# Patient Record
Sex: Female | Born: 1935 | Race: White | Hispanic: No | Smoking: Never smoker
Health system: Southern US, Community
[De-identification: ages and names within clinical notes are randomized; demographics above are authoritative.]

## PROBLEM LIST (undated history)

## (undated) DIAGNOSIS — R55 Syncope and collapse: Secondary | ICD-10-CM

## (undated) DIAGNOSIS — J189 Pneumonia, unspecified organism: Secondary | ICD-10-CM

## (undated) DIAGNOSIS — E039 Hypothyroidism, unspecified: Secondary | ICD-10-CM

## (undated) DIAGNOSIS — H3530 Unspecified macular degeneration: Secondary | ICD-10-CM

## (undated) DIAGNOSIS — D649 Anemia, unspecified: Secondary | ICD-10-CM

## (undated) DIAGNOSIS — K08109 Complete loss of teeth, unspecified cause, unspecified class: Secondary | ICD-10-CM

## (undated) DIAGNOSIS — M1990 Unspecified osteoarthritis, unspecified site: Secondary | ICD-10-CM

## (undated) DIAGNOSIS — H9193 Unspecified hearing loss, bilateral: Secondary | ICD-10-CM

## (undated) DIAGNOSIS — R112 Nausea with vomiting, unspecified: Secondary | ICD-10-CM

## (undated) DIAGNOSIS — Z9889 Other specified postprocedural states: Secondary | ICD-10-CM

## (undated) DIAGNOSIS — F419 Anxiety disorder, unspecified: Secondary | ICD-10-CM

## (undated) DIAGNOSIS — E782 Mixed hyperlipidemia: Secondary | ICD-10-CM

## (undated) DIAGNOSIS — M51369 Other intervertebral disc degeneration, lumbar region without mention of lumbar back pain or lower extremity pain: Secondary | ICD-10-CM

## (undated) DIAGNOSIS — N390 Urinary tract infection, site not specified: Secondary | ICD-10-CM

## (undated) DIAGNOSIS — K219 Gastro-esophageal reflux disease without esophagitis: Secondary | ICD-10-CM

## (undated) DIAGNOSIS — Z972 Presence of dental prosthetic device (complete) (partial): Secondary | ICD-10-CM

## (undated) DIAGNOSIS — I509 Heart failure, unspecified: Secondary | ICD-10-CM

## (undated) DIAGNOSIS — M5136 Other intervertebral disc degeneration, lumbar region: Secondary | ICD-10-CM

## (undated) DIAGNOSIS — I350 Nonrheumatic aortic (valve) stenosis: Secondary | ICD-10-CM

## (undated) DIAGNOSIS — K759 Inflammatory liver disease, unspecified: Secondary | ICD-10-CM

## (undated) DIAGNOSIS — I10 Essential (primary) hypertension: Secondary | ICD-10-CM

## (undated) DIAGNOSIS — R011 Cardiac murmur, unspecified: Secondary | ICD-10-CM

## (undated) HISTORY — DX: Unspecified osteoarthritis, unspecified site: M19.90

## (undated) HISTORY — DX: Anxiety disorder, unspecified: F41.9

## (undated) HISTORY — DX: Mixed hyperlipidemia: E78.2

## (undated) HISTORY — PX: THYROID SURGERY: SHX805

## (undated) HISTORY — PX: CATARACT EXTRACTION: SUR2

## (undated) HISTORY — DX: Nonrheumatic aortic (valve) stenosis: I35.0

## (undated) HISTORY — DX: Heart failure, unspecified: I50.9

## (undated) HISTORY — DX: Essential (primary) hypertension: I10

## (undated) HISTORY — PX: EYE SURGERY: SHX253

## (undated) HISTORY — DX: Hypothyroidism, unspecified: E03.9

## (undated) HISTORY — DX: Pneumonia, unspecified organism: J18.9

## (undated) HISTORY — DX: Cardiac murmur, unspecified: R01.1

## (undated) HISTORY — DX: Urinary tract infection, site not specified: N39.0

## (undated) HISTORY — DX: Syncope and collapse: R55

## (undated) HISTORY — PX: FOOT SURGERY: SHX648

---

## 1962-01-10 DIAGNOSIS — K759 Inflammatory liver disease, unspecified: Secondary | ICD-10-CM

## 1962-01-10 HISTORY — DX: Inflammatory liver disease, unspecified: K75.9

## 1998-01-07 ENCOUNTER — Ambulatory Visit (HOSPITAL_COMMUNITY): Admission: RE | Admit: 1998-01-07 | Discharge: 1998-01-07 | Payer: Self-pay | Admitting: Obstetrics and Gynecology

## 1998-01-07 ENCOUNTER — Encounter: Payer: Self-pay | Admitting: Obstetrics and Gynecology

## 1998-08-27 ENCOUNTER — Other Ambulatory Visit: Admission: RE | Admit: 1998-08-27 | Discharge: 1998-08-27 | Payer: Self-pay | Admitting: Obstetrics and Gynecology

## 1999-07-20 ENCOUNTER — Other Ambulatory Visit: Admission: RE | Admit: 1999-07-20 | Discharge: 1999-07-20 | Payer: Self-pay | Admitting: Obstetrics and Gynecology

## 2000-06-14 ENCOUNTER — Other Ambulatory Visit: Admission: RE | Admit: 2000-06-14 | Discharge: 2000-06-14 | Payer: Self-pay | Admitting: Obstetrics and Gynecology

## 2000-10-23 ENCOUNTER — Encounter: Admission: RE | Admit: 2000-10-23 | Discharge: 2000-10-23 | Payer: Self-pay | Admitting: Obstetrics and Gynecology

## 2000-10-23 ENCOUNTER — Encounter: Payer: Self-pay | Admitting: Obstetrics and Gynecology

## 2001-01-15 ENCOUNTER — Ambulatory Visit (HOSPITAL_COMMUNITY): Admission: RE | Admit: 2001-01-15 | Discharge: 2001-01-15 | Payer: Self-pay | Admitting: Specialist

## 2001-03-26 ENCOUNTER — Ambulatory Visit (HOSPITAL_COMMUNITY): Admission: RE | Admit: 2001-03-26 | Discharge: 2001-03-26 | Payer: Self-pay | Admitting: Specialist

## 2001-06-07 ENCOUNTER — Encounter: Admission: RE | Admit: 2001-06-07 | Discharge: 2001-06-07 | Payer: Self-pay | Admitting: Family Medicine

## 2001-06-07 ENCOUNTER — Encounter: Payer: Self-pay | Admitting: Family Medicine

## 2001-08-07 ENCOUNTER — Other Ambulatory Visit: Admission: RE | Admit: 2001-08-07 | Discharge: 2001-08-07 | Payer: Self-pay | Admitting: Gynecology

## 2001-11-19 ENCOUNTER — Encounter: Payer: Self-pay | Admitting: Ophthalmology

## 2001-11-19 ENCOUNTER — Ambulatory Visit (HOSPITAL_COMMUNITY): Admission: RE | Admit: 2001-11-19 | Discharge: 2001-11-20 | Payer: Self-pay | Admitting: Ophthalmology

## 2002-01-10 HISTORY — PX: BACK SURGERY: SHX140

## 2002-05-14 ENCOUNTER — Ambulatory Visit (HOSPITAL_BASED_OUTPATIENT_CLINIC_OR_DEPARTMENT_OTHER): Admission: RE | Admit: 2002-05-14 | Discharge: 2002-05-15 | Payer: Self-pay | Admitting: Orthopedic Surgery

## 2002-10-14 ENCOUNTER — Inpatient Hospital Stay (HOSPITAL_COMMUNITY): Admission: RE | Admit: 2002-10-14 | Discharge: 2002-10-18 | Payer: Self-pay | Admitting: Specialist

## 2002-10-14 ENCOUNTER — Encounter: Payer: Self-pay | Admitting: Specialist

## 2003-08-18 ENCOUNTER — Other Ambulatory Visit: Admission: RE | Admit: 2003-08-18 | Discharge: 2003-08-18 | Payer: Self-pay | Admitting: Gynecology

## 2004-01-11 DIAGNOSIS — I509 Heart failure, unspecified: Secondary | ICD-10-CM

## 2004-01-11 DIAGNOSIS — J189 Pneumonia, unspecified organism: Secondary | ICD-10-CM

## 2004-01-11 HISTORY — DX: Pneumonia, unspecified organism: J18.9

## 2004-01-11 HISTORY — DX: Heart failure, unspecified: I50.9

## 2004-02-20 ENCOUNTER — Ambulatory Visit (HOSPITAL_COMMUNITY): Admission: RE | Admit: 2004-02-20 | Discharge: 2004-02-21 | Payer: Self-pay | Admitting: Ophthalmology

## 2004-05-31 ENCOUNTER — Ambulatory Visit (HOSPITAL_COMMUNITY): Admission: RE | Admit: 2004-05-31 | Discharge: 2004-05-31 | Payer: Self-pay | Admitting: Ophthalmology

## 2004-06-16 ENCOUNTER — Ambulatory Visit (HOSPITAL_COMMUNITY): Admission: RE | Admit: 2004-06-16 | Discharge: 2004-06-16 | Payer: Self-pay | Admitting: Gynecology

## 2004-10-31 ENCOUNTER — Inpatient Hospital Stay (HOSPITAL_COMMUNITY): Admission: EM | Admit: 2004-10-31 | Discharge: 2004-11-13 | Payer: Self-pay | Admitting: *Deleted

## 2004-10-31 ENCOUNTER — Ambulatory Visit: Payer: Self-pay | Admitting: Physical Medicine & Rehabilitation

## 2004-11-04 ENCOUNTER — Encounter: Payer: Self-pay | Admitting: Cardiology

## 2004-11-04 ENCOUNTER — Ambulatory Visit: Payer: Self-pay | Admitting: Internal Medicine

## 2004-11-24 ENCOUNTER — Encounter: Admission: RE | Admit: 2004-11-24 | Discharge: 2004-11-24 | Payer: Self-pay | Admitting: Family Medicine

## 2005-02-16 ENCOUNTER — Ambulatory Visit: Payer: Self-pay | Admitting: Internal Medicine

## 2005-02-24 ENCOUNTER — Ambulatory Visit: Payer: Self-pay | Admitting: Internal Medicine

## 2005-05-11 ENCOUNTER — Encounter: Payer: Self-pay | Admitting: Specialist

## 2006-02-06 ENCOUNTER — Encounter: Admission: RE | Admit: 2006-02-06 | Discharge: 2006-02-06 | Payer: Self-pay | Admitting: Internal Medicine

## 2011-02-07 ENCOUNTER — Encounter: Payer: Self-pay | Admitting: Cardiology

## 2011-02-22 ENCOUNTER — Encounter: Payer: Self-pay | Admitting: *Deleted

## 2011-02-22 ENCOUNTER — Ambulatory Visit (INDEPENDENT_AMBULATORY_CARE_PROVIDER_SITE_OTHER): Payer: Medicare Other | Admitting: Cardiology

## 2011-02-22 ENCOUNTER — Encounter: Payer: Self-pay | Admitting: Cardiology

## 2011-02-22 VITALS — BP 122/72 | HR 67 | Ht 63.0 in | Wt 147.0 lb

## 2011-02-22 DIAGNOSIS — E7800 Pure hypercholesterolemia, unspecified: Secondary | ICD-10-CM | POA: Insufficient documentation

## 2011-02-22 DIAGNOSIS — R011 Cardiac murmur, unspecified: Secondary | ICD-10-CM | POA: Insufficient documentation

## 2011-02-22 DIAGNOSIS — R0609 Other forms of dyspnea: Secondary | ICD-10-CM

## 2011-02-22 DIAGNOSIS — R0600 Dyspnea, unspecified: Secondary | ICD-10-CM | POA: Insufficient documentation

## 2011-02-22 DIAGNOSIS — R5381 Other malaise: Secondary | ICD-10-CM

## 2011-02-22 DIAGNOSIS — R5383 Other fatigue: Secondary | ICD-10-CM

## 2011-02-22 DIAGNOSIS — I10 Essential (primary) hypertension: Secondary | ICD-10-CM | POA: Insufficient documentation

## 2011-02-22 NOTE — Patient Instructions (Signed)
We will schedule for an echocardiogram.  Continue your current medications.

## 2011-02-22 NOTE — Progress Notes (Signed)
Horace Porteous Date of Birth: 1933/10/23 Medical Record #[id number]  History of Present Illness: Aimee Ward is seen at the request of Dr. Collins Scotland for evaluation of dyspnea. She is a pleasant 78 year old white female who has a history of hypertension and hyperlipidemia. She was evaluated by Dr. Reyes Ivan in 2008 for symptoms of a rapid heartbeat. She reports she had a stress test done at that time which was benign. Her symptoms of palpitations resolved with beta blocker therapy. Those old records are not available to review. Over the past several months she has been experiencing symptoms of fatigue with exertion and shortness of breath. She has 16 steps that she has to walk up and home and when she gets to the top she is completely given out and feels short of breath. She gets easily tired with walking or doing gardening. She denies any significant chest pain. A murmur was heard on her recent evaluation.  Current Outpatient Prescriptions on File Prior to Visit  Medication Sig Dispense Refill  . ALPRAZolam (XANAX) 0.5 MG tablet Take 0.5 mg by mouth at bedtime as needed.      Marland Kitchen amLODipine (NORVASC) 5 MG tablet Take 5 mg by mouth daily.      . citalopram (CELEXA) 10 MG tablet Take 10 mg by mouth daily.      Marland Kitchen conjugated estrogens (PREMARIN) vaginal cream Place vaginally as needed.       Marland Kitchen HYDROcodone-acetaminophen (NORCO) 10-325 MG per tablet Take 1 tablet by mouth every 6 (six) hours as needed.      Marland Kitchen levothyroxine (SYNTHROID, LEVOTHROID) 75 MCG tablet Take 75 mcg by mouth daily.      Marland Kitchen loratadine (CLARITIN) 10 MG tablet Take 10 mg by mouth daily.      . metoprolol (LOPRESSOR) 50 MG tablet Take 50 mg by mouth 2 (two) times daily.      Marland Kitchen omeprazole (PRILOSEC) 20 MG capsule Take 20 mg by mouth as needed.       . pravastatin (PRAVACHOL) 40 MG tablet Take 40 mg by mouth daily.        Allergies  Allergen Reactions  . Hctz (Hydrochlorothiazide)     Cramps   . Lisinopril     itching  . Prednisone      Very high BP    Past Medical History  Diagnosis Date  . Anxiety   . CHF (congestive heart failure) 2006  . HTN (hypertension), benign   . Hypothyroidism   . Mixed hyperlipidemia   . Osteoarthritis   . Migraine   . PNA (pneumonia) 2006  . Murmur     Past Surgical History  Procedure Date  . Back surgery   . Foot surgery   . Thyroid surgery   . Cataract extraction     History  Smoking status  . Never Smoker   Smokeless tobacco  . Never Used    History  Alcohol Use No    Family History  Problem Relation Age of Onset  . Diabetes    . Hypertension    . Stroke    . Arthritis    . Migraines    . Hypertension Mother   . Stroke Mother   . Diabetes Father   . Hypertension Brother   . Rheumatic fever Brother     Review of Systems: The review of systems is positive for occasional symptoms of dizziness. She occasionally loses her balance. She was told that she had congestive heart failure in 2006 but it appears  that this was during hospitalization when she predominantly had pneumonia..  All other systems were reviewed and are negative.  Physical Exam: BP 122/72  Pulse 67  Ht 5\' 3"  (1.6 m)  Wt 147 lb (66.679 kg)  BMI 26.04 kg/m2 She is a pleasant white female in no acute distress.The patient is alert and oriented x 3.  The mood and affect are normal.  The skin is warm and dry.  Color is normal.  The HEENT exam reveals that the sclera are nonicteric.  The mucous membranes are moist.  The carotids are 2+ without bruits. Carotid upstrokes are diminished. There is no thyromegaly.  There is no JVD.  The lungs are clear.  The chest wall is non tender.  The heart exam reveals a regular rate with a normal S1 and S2.  There is a harsh grade 2-3/6 systolic murmur heard best at the right upper sternal border radiating to the carotids and to the left sternal border..  The PMI is not displaced.   Abdominal exam reveals good bowel sounds.  There is no guarding or rebound.  There is  no hepatosplenomegaly or tenderness.  There are no masses.  Exam of the legs reveal no clubbing, cyanosis, or edema.  The legs are without rashes.  The distal pulses are intact.  Cranial nerves II - XII are intact.  Motor and sensory functions are intact.  The gait is normal.  Laboratory data: ECG today shows normal sinus rhythm with a normal ECG. Recent CBC was normal. Urinalysis was negative. Total cholesterol 142, triglycerides 82, HDL 35, LDL 90. Chemistries were normal. TSH was 0.294   Assessment / Plan:

## 2011-02-22 NOTE — Assessment & Plan Note (Signed)
Well-controlled on current medications 

## 2011-02-22 NOTE — Assessment & Plan Note (Signed)
Exam is most consistent with aortic stenosis. We will obtain an echocardiogram to further evaluate.

## 2011-02-22 NOTE — Assessment & Plan Note (Signed)
Based on her exam today I am most concerned that she may have significant aortic stenosis. We will obtain an echocardiogram. It is possible that she could have atypical presentation for coronary disease given her risk factors of hypertension and hyperlipidemia. If her echocardiogram is unremarkable we may need to consider stress testing as well.

## 2011-03-01 ENCOUNTER — Other Ambulatory Visit: Payer: Self-pay

## 2011-03-01 ENCOUNTER — Ambulatory Visit (HOSPITAL_COMMUNITY): Payer: Medicare Other | Attending: Cardiology

## 2011-03-01 DIAGNOSIS — R5381 Other malaise: Secondary | ICD-10-CM | POA: Insufficient documentation

## 2011-03-01 DIAGNOSIS — I379 Nonrheumatic pulmonary valve disorder, unspecified: Secondary | ICD-10-CM | POA: Insufficient documentation

## 2011-03-01 DIAGNOSIS — R0609 Other forms of dyspnea: Secondary | ICD-10-CM

## 2011-03-01 DIAGNOSIS — I10 Essential (primary) hypertension: Secondary | ICD-10-CM

## 2011-03-01 DIAGNOSIS — R011 Cardiac murmur, unspecified: Secondary | ICD-10-CM

## 2011-03-01 DIAGNOSIS — I079 Rheumatic tricuspid valve disease, unspecified: Secondary | ICD-10-CM | POA: Insufficient documentation

## 2011-03-01 DIAGNOSIS — I080 Rheumatic disorders of both mitral and aortic valves: Secondary | ICD-10-CM | POA: Insufficient documentation

## 2011-03-01 DIAGNOSIS — R0600 Dyspnea, unspecified: Secondary | ICD-10-CM

## 2011-03-01 DIAGNOSIS — E785 Hyperlipidemia, unspecified: Secondary | ICD-10-CM | POA: Insufficient documentation

## 2011-03-01 DIAGNOSIS — R5383 Other fatigue: Secondary | ICD-10-CM

## 2011-03-07 ENCOUNTER — Other Ambulatory Visit: Payer: Self-pay

## 2011-03-07 DIAGNOSIS — R0600 Dyspnea, unspecified: Secondary | ICD-10-CM

## 2011-03-17 ENCOUNTER — Ambulatory Visit (HOSPITAL_COMMUNITY): Payer: Medicare Other | Attending: Cardiology | Admitting: Radiology

## 2011-03-17 VITALS — BP 136/77 | Ht 63.0 in | Wt 146.0 lb

## 2011-03-17 DIAGNOSIS — R0789 Other chest pain: Secondary | ICD-10-CM | POA: Insufficient documentation

## 2011-03-17 DIAGNOSIS — R0600 Dyspnea, unspecified: Secondary | ICD-10-CM

## 2011-03-17 DIAGNOSIS — R0602 Shortness of breath: Secondary | ICD-10-CM

## 2011-03-17 DIAGNOSIS — E785 Hyperlipidemia, unspecified: Secondary | ICD-10-CM | POA: Insufficient documentation

## 2011-03-17 DIAGNOSIS — R0609 Other forms of dyspnea: Secondary | ICD-10-CM | POA: Insufficient documentation

## 2011-03-17 DIAGNOSIS — R002 Palpitations: Secondary | ICD-10-CM | POA: Insufficient documentation

## 2011-03-17 DIAGNOSIS — R000 Tachycardia, unspecified: Secondary | ICD-10-CM | POA: Insufficient documentation

## 2011-03-17 DIAGNOSIS — I10 Essential (primary) hypertension: Secondary | ICD-10-CM

## 2011-03-17 DIAGNOSIS — R5383 Other fatigue: Secondary | ICD-10-CM

## 2011-03-17 DIAGNOSIS — R5381 Other malaise: Secondary | ICD-10-CM | POA: Insufficient documentation

## 2011-03-17 DIAGNOSIS — R0989 Other specified symptoms and signs involving the circulatory and respiratory systems: Secondary | ICD-10-CM | POA: Insufficient documentation

## 2011-03-17 DIAGNOSIS — E7800 Pure hypercholesterolemia, unspecified: Secondary | ICD-10-CM

## 2011-03-17 DIAGNOSIS — R42 Dizziness and giddiness: Secondary | ICD-10-CM | POA: Insufficient documentation

## 2011-03-17 MED ORDER — TECHNETIUM TC 99M TETROFOSMIN IV KIT
11.0000 | PACK | Freq: Once | INTRAVENOUS | Status: AC | PRN
  Administered 2011-03-17: 11 via INTRAVENOUS

## 2011-03-17 MED ORDER — TECHNETIUM TC 99M TETROFOSMIN IV KIT
33.0000 | PACK | Freq: Once | INTRAVENOUS | Status: AC | PRN
  Administered 2011-03-17: 33 via INTRAVENOUS

## 2011-03-17 MED ORDER — REGADENOSON 0.4 MG/5ML IV SOLN
0.4000 mg | Freq: Once | INTRAVENOUS | Status: AC
  Administered 2011-03-17: 0.4 mg via INTRAVENOUS

## 2011-03-17 NOTE — Progress Notes (Signed)
[facility] SITE 3 NUCLEAR MED [address] [place] [address] [phone]  Cardiology Nuclear Med Study  Nobie Alleyne Sylvia is a 78 y.o. female [id number] [birth year]   Nuclear Med Background Indication for Stress Test:  Evaluation for Ischemia and Significant Aortic Stenosis vs CAD History:  '08 MPS:No ischemia, EF=71%; 03/01/11 Echo:EF=55-65%, mild AS/MR and mild LVH. Cardiac Risk Factors: Hypertension and Lipids  Symptoms:  Chest Tightness (last episode of chest discomfort was yesterday), Dizziness, DOE, Fatigue, Palpitations and Rapid HR   Nuclear Pre-Procedure Caffeine/Decaff Intake:  None NPO After: 5 pm   Lungs:  Clear.  O2 SAT 98% on RA. IV 0.9% NS with Angio Cath:  22g  IV Site: R Hand  IV Started by:  Bonnita Levan, RN  Chest Size (in):  34 Cup Size: B  Height: 5\' 3"  (1.6 m)  Weight:  146 lb (66.225 kg)  BMI:  Body mass index is 25.86 kg/(m^2). Tech Comments:  Metoprolol this AM    Nuclear Med Study 1 or 2 day study: 1 day  Stress Test Type:  Lexiscan  Reading MD: Marca Ancona, MD  Order Authorizing Provider:  Peter Swaziland, MD  Resting Radionuclide: Technetium 54m Tetrofosmin  Resting Radionuclide Dose: 11.0 mCi   Stress Radionuclide:  Technetium 36m Tetrofosmin  Stress Radionuclide Dose: 32.5 mCi           Stress Protocol Rest HR: 67 Stress HR: 95  Rest BP: 136/77 Stress BP: 134/76  Exercise Time (min): n/a METS: n/a   Predicted Max HR: 145 bpm % Max HR: 65.52 bpm Rate Pressure Product: 21308   Dose of Adenosine (mg):  n/a Dose of Lexiscan: 0.4 mg  Dose of Atropine (mg): n/a Dose of Dobutamine: n/a mcg/kg/min (at max HR)  Stress Test Technologist: Smiley Houseman, CMA-N  Nuclear Technologist:  Domenic Polite, CNMT     Rest Procedure:  Myocardial perfusion imaging was performed at rest 45 minutes following the intravenous administration of Technetium 41m Tetrofosmin.  Rest ECG: No acute changes.  Stress Procedure:  The patient  received IV Lexiscan 0.4 mg over 15-seconds.  Technetium 66m Tetrofosmin injected at 30-seconds.  There were no significant changes with Lexiscan, occasional PVC's noted.  Quantitative spect images were obtained after a 45 minute delay.  Stress ECG: No significant change from baseline ECG  QPS Raw Data Images:  Normal; no motion artifact; normal heart/lung ratio. Stress Images:  Normal homogeneous uptake in all areas of the myocardium. Rest Images:  Normal homogeneous uptake in all areas of the myocardium. Subtraction (SDS):  There is no evidence of scar or ischemia. Transient Ischemic Dilatation (Normal <1.22):  0.99 Lung/Heart Ratio (Normal <0.45):  0.34  Quantitative Gated Spect Images QGS EDV:  49 ml QGS ESV:  11 ml QGS cine images:  NL LV Function; NL Wall Motion QGS EF: 78%  Impression Exercise Capacity:  Lexiscan with no exercise. BP Response:  Normal blood pressure response. Clinical Symptoms:  Short of breath.  ECG Impression:  No significant ST segment change suggestive of ischemia. Comparison with Prior Nuclear Study: No significant change from previous study  Overall Impression:  Normal stress nuclear study.  Govanni Plemons [employer]

## 2011-06-10 ENCOUNTER — Encounter: Payer: Self-pay | Admitting: *Deleted

## 2012-09-19 ENCOUNTER — Encounter: Payer: Self-pay | Admitting: Nurse Practitioner

## 2012-09-19 ENCOUNTER — Ambulatory Visit (INDEPENDENT_AMBULATORY_CARE_PROVIDER_SITE_OTHER): Payer: Medicare Other | Admitting: Nurse Practitioner

## 2012-09-19 ENCOUNTER — Encounter (INDEPENDENT_AMBULATORY_CARE_PROVIDER_SITE_OTHER): Payer: Medicare Other

## 2012-09-19 VITALS — BP 138/72 | HR 64 | Ht 64.0 in | Wt 149.4 lb

## 2012-09-19 DIAGNOSIS — R55 Syncope and collapse: Secondary | ICD-10-CM

## 2012-09-19 NOTE — Progress Notes (Addendum)
Aimee Ward Date of Birth: 1933/01/25 Medical Record #[id number]  History of Present Illness: [name] is seen back today for a work in visit. Seen for Dr. Swaziland. She has HTN and HLD. Last seen here back in February of 2013 - noted to have AS - but was mild per echo with a normal EF.   Comes in today. Here with her son. She is seen at the request of Dr. Alda Berthold. Has started having "black out spells". She does not totally pass out but "everything goes black". This started August 25th. She had 4 spells in 5 days. Has happened with exertion and at rest. Saw her PCP Friday - Lopressor was cut back. Sounds like she was orthostatic during that visit - thus the medicine was cut back. Had labs and EKG - told ok - I do not have the records. She has had progressive DOE - worse over the past 6 months. No chest pain. No palpitations. Son notes that she does not eat properly - weight is stable.  Current Outpatient Prescriptions  Medication Sig Dispense Refill  . ALPRAZolam (XANAX) 0.5 MG tablet Take 0.5 mg by mouth at bedtime as needed.      Marland Kitchen amLODipine (NORVASC) 5 MG tablet Take 5 mg by mouth daily.      . citalopram (CELEXA) 10 MG tablet Take 10 mg by mouth daily.      Marland Kitchen levothyroxine (SYNTHROID, LEVOTHROID) 75 MCG tablet Take 75 mcg by mouth daily.      Marland Kitchen loratadine (CLARITIN) 10 MG tablet Take 10 mg by mouth as needed.       . metoprolol (LOPRESSOR) 50 MG tablet Take 25 mg by mouth 2 (two) times daily.       Marland Kitchen omeprazole (PRILOSEC) 20 MG capsule Take 20 mg by mouth as needed.       . pravastatin (PRAVACHOL) 40 MG tablet Take 80 mg by mouth daily.        No current facility-administered medications for this visit.    Allergies  Allergen Reactions  . Hctz [Hydrochlorothiazide]     Cramps   . Lisinopril     itching  . Prednisone     Very high BP    Past Medical History  Diagnosis Date  . Anxiety   . CHF (congestive heart failure) 2006  . HTN (hypertension), benign   .  Hypothyroidism   . Mixed hyperlipidemia   . Osteoarthritis   . Migraine   . PNA (pneumonia) 2006  . Murmur     Past Surgical History  Procedure Laterality Date  . Back surgery    . Foot surgery    . Thyroid surgery    . Cataract extraction      History  Smoking status  . Never Smoker   Smokeless tobacco  . Never Used    History  Alcohol Use No    Family History  Problem Relation Age of Onset  . Diabetes    . Hypertension    . Stroke    . Arthritis    . Migraines    . Hypertension Mother   . Stroke Mother   . Diabetes Father   . Hypertension Brother   . Rheumatic fever Brother     Review of Systems: The review of systems is per the HPI.  All other systems were reviewed and are negative.  Physical Exam: BP 138/72  Pulse 64  Ht 5\' 4"  (1.626 m)  Wt 149 lb 6.4 oz (67.767  kg)  BMI 25.63 kg/m2 BP is 130/80 sitting and 110/80 standing by me. Patient is very pleasant and in no acute distress. Skin is warm and dry. Color is normal.  HEENT is unremarkable. Normocephalic/atraumatic. PERRL. Sclera are nonicteric. Neck is supple. No masses. No JVD. Lungs are clear. Cardiac exam shows a regular rate and rhythm. Grade 2/6 outflow murmur noted of AS. Abdomen is soft. Extremities are without edema. Gait and ROM are intact. No gross neurologic deficits noted.  LABORATORY DATA:  No results found for this basename: WBC, HGB, HCT, PLT, GLUCOSE, CHOL, TRIG, HDL, LDLDIRECT, LDLCALC, ALT, AST, NA, K, CL, CREATININE, BUN, CO2, TSH, PSA, INR, GLUF, HGBA1C, MICROALBUR     Echo Study Conclusions from February 2013  - Left ventricle: The cavity size was normal. Wall thickness was increased in a pattern of mild LVH. There was mild focal basal hypertrophy of the septum. Systolic function was normal. The estimated ejection fraction was in the range of 55% to 65%. Wall motion was normal; there were no regional wall motion abnormalities. Doppler parameters are consistent with abnormal  left ventricular relaxation (grade 1 diastolic dysfunction). - Aortic valve: There was mild stenosis. Trivial regurgitation. - Mitral valve: Calcified annulus. Mild regurgitation.  Assessment / Plan:  Presyncope/syncope - has known AS - with progressive dyspnea as well - will update the echo. Place event monitor to rule out arrhythmia. Last spell was last Friday and not having every day - she has done better with medicine reduction. Encouraged her to increase her protein intake. Further disposition to follow.   Patient is agreeable to this plan and will call if any problems develop in the interim.   Rosalio Macadamia, RN, ANP-C [facility] [address] [place], [place]  [address]   Addendum:  EKG from Dr. Alda Berthold office showed sinus rhythm. Records received 09/21/12

## 2012-09-19 NOTE — Patient Instructions (Addendum)
We need to get an ultrasound of your heart  We need to put a heart monitor on  Try to increase your intake of protein - eat regularly  See Dr. Swaziland in 5 weeks  Call the [facility] office at [phone] if you have any questions, problems or concerns.

## 2012-09-24 ENCOUNTER — Other Ambulatory Visit (HOSPITAL_COMMUNITY): Payer: Self-pay | Admitting: Radiology

## 2012-09-24 ENCOUNTER — Ambulatory Visit (HOSPITAL_COMMUNITY): Payer: Medicare Other | Attending: Nurse Practitioner | Admitting: Radiology

## 2012-09-24 DIAGNOSIS — R55 Syncope and collapse: Secondary | ICD-10-CM | POA: Insufficient documentation

## 2012-09-24 DIAGNOSIS — R0609 Other forms of dyspnea: Secondary | ICD-10-CM | POA: Insufficient documentation

## 2012-09-24 DIAGNOSIS — I080 Rheumatic disorders of both mitral and aortic valves: Secondary | ICD-10-CM | POA: Insufficient documentation

## 2012-09-24 DIAGNOSIS — I10 Essential (primary) hypertension: Secondary | ICD-10-CM | POA: Insufficient documentation

## 2012-09-24 DIAGNOSIS — E7800 Pure hypercholesterolemia, unspecified: Secondary | ICD-10-CM | POA: Insufficient documentation

## 2012-09-24 DIAGNOSIS — I509 Heart failure, unspecified: Secondary | ICD-10-CM | POA: Insufficient documentation

## 2012-09-24 DIAGNOSIS — R0989 Other specified symptoms and signs involving the circulatory and respiratory systems: Secondary | ICD-10-CM | POA: Insufficient documentation

## 2012-09-24 DIAGNOSIS — R011 Cardiac murmur, unspecified: Secondary | ICD-10-CM | POA: Insufficient documentation

## 2012-09-24 NOTE — Progress Notes (Signed)
Echocardiogram performed.  

## 2012-09-24 NOTE — Addendum Note (Signed)
Addended by: Aida Raider T on: 09/24/2012 04:48 PM   Modules accepted: Orders

## 2012-09-25 NOTE — Addendum Note (Signed)
Addended by: Aida Raider T on: 09/25/2012 07:18 AM   Modules accepted: Orders

## 2012-09-25 NOTE — Addendum Note (Signed)
Addended by: Aida Raider T on: 09/25/2012 07:13 AM   Modules accepted: Orders

## 2012-10-16 ENCOUNTER — Encounter: Payer: Self-pay | Admitting: Internal Medicine

## 2012-10-24 ENCOUNTER — Telehealth: Payer: Self-pay

## 2012-10-24 NOTE — Telephone Encounter (Signed)
Patient called Dr.Jordan reviewed event monitor which revealed normal sinus rhythm.Advised to keep appointment with Dr.Jordan 10/26/12.

## 2012-10-26 ENCOUNTER — Encounter: Payer: Self-pay | Admitting: Cardiology

## 2012-10-26 ENCOUNTER — Ambulatory Visit (INDEPENDENT_AMBULATORY_CARE_PROVIDER_SITE_OTHER): Payer: Medicare Other | Admitting: Cardiology

## 2012-10-26 VITALS — BP 120/65 | HR 80 | Ht 63.0 in | Wt 149.0 lb

## 2012-10-26 DIAGNOSIS — I359 Nonrheumatic aortic valve disorder, unspecified: Secondary | ICD-10-CM

## 2012-10-26 DIAGNOSIS — I350 Nonrheumatic aortic (valve) stenosis: Secondary | ICD-10-CM

## 2012-10-26 DIAGNOSIS — R55 Syncope and collapse: Secondary | ICD-10-CM

## 2012-10-26 DIAGNOSIS — I10 Essential (primary) hypertension: Secondary | ICD-10-CM

## 2012-10-26 MED ORDER — AMLODIPINE BESYLATE 2.5 MG PO TABS: 2.5000 mg | ORAL_TABLET | Freq: Every day | ORAL | Status: DC

## 2012-10-26 NOTE — Progress Notes (Signed)
Horace Porteous Date of Birth: 01-13-1933 Medical Record #[id number]  History of Present Illness: [name] is seen for followup of near syncope. She has HTN and HLD. She has a history of moderate aortic stenosis. The patient was seen last month with symptoms of near syncope. She did not completely black out but the complained of postural episodes of near syncope. After reduction in her Lopressor dose her symptoms did improve significantly. She is still having symptoms when she bends over and then stands up. She wore an event monitor for one month. She did have some symptoms while wearing the monitor. It showed normal sinus rhythm.   Current Outpatient Prescriptions  Medication Sig Dispense Refill  . ALPRAZolam (XANAX) 0.5 MG tablet Take 0.5 mg by mouth at bedtime as needed.      Marland Kitchen levothyroxine (SYNTHROID, LEVOTHROID) 75 MCG tablet Take 75 mcg by mouth daily.      Marland Kitchen loratadine (CLARITIN) 10 MG tablet Take 10 mg by mouth as needed.       . metoprolol (LOPRESSOR) 50 MG tablet Take 25 mg by mouth 2 (two) times daily.       Marland Kitchen omeprazole (PRILOSEC) 20 MG capsule Take 20 mg by mouth as needed.       . pravastatin (PRAVACHOL) 40 MG tablet Take 80 mg by mouth daily.       Marland Kitchen amLODipine (NORVASC) 2.5 MG tablet Take 1 tablet (2.5 mg total) by mouth daily.  180 tablet  3  . citalopram (CELEXA) 10 MG tablet Take 10 mg by mouth daily.       No current facility-administered medications for this visit.    Allergies  Allergen Reactions  . Hctz [Hydrochlorothiazide]     Cramps   . Lisinopril     itching  . Prednisone     Very high BP    Past Medical History  Diagnosis Date  . Anxiety   . CHF (congestive heart failure) 2006  . HTN (hypertension), benign   . Hypothyroidism   . Mixed hyperlipidemia   . Osteoarthritis   . Migraine   . PNA (pneumonia) 2006  . Murmur     Past Surgical History  Procedure Laterality Date  . Back surgery    . Foot surgery    . Thyroid surgery    .  Cataract extraction      History  Smoking status  . Never Smoker   Smokeless tobacco  . Never Used    History  Alcohol Use No    Family History  Problem Relation Age of Onset  . Diabetes    . Hypertension    . Stroke    . Arthritis    . Migraines    . Hypertension Mother   . Stroke Mother   . Diabetes Father   . Hypertension Brother   . Rheumatic fever Brother     Review of Systems: The review of systems is per the HPI.  All other systems were reviewed and are negative.  Physical Exam: BP 120/65  Pulse 80  Ht 5\' 3"  (1.6 m)  Wt 149 lb (67.586 kg)  BMI 26.4 kg/m2 Patient is very pleasant and in no acute distress. Skin is warm and dry. Color is normal.  HEENT is unremarkable. Normocephalic/atraumatic. PERRL. Sclera are nonicteric. Neck is supple. No masses. No JVD. Lungs are clear. Cardiac exam shows a regular rate and rhythm. There is a harsh Grade 2/6 outflow murmur noted of AS. Abdomen is soft. Extremities are  without edema. Gait and ROM are intact. No gross neurologic deficits noted.  LABORATORY DATA:    Echo Study :Study Conclusions  - Left ventricle: The cavity size was normal. Wall thickness was increased in a pattern of moderate LVH. There was mild focal basal hypertrophy of the septum. Systolic function was normal. The estimated ejection fraction was in the range of 55% to 60%. Wall motion was normal; there were no regional wall motion abnormalities. Doppler parameters are consistent with abnormal left ventricular relaxation (grade 1 diastolic dysfunction). Doppler parameters are consistent with high ventricular filling pressure. - Aortic valve: Valve mobility was restricted. There was moderate stenosis. Trivial regurgitation. - Mitral valve: Calcified annulus. - Left atrium: The atrium was mildly dilated    Assessment / Plan: 1. Presyncope/syncope - this appears to be related to postural hypotension. Event monitor was unremarkable. Echocardiogram  shows only modest aortic stenosis. Blood pressure is still under excellent control. I recommended reducing her medications further and we'll cut her amlodipine to 2.5 mg daily. I'll followup again in 6 months.  2. Moderate aortic stenosis. This has not changed significantly since February 2013. Mean gradient was 22 mmHg with a peak gradient of 31 mmHg. Recommend followup echocardiogram in 2 years.  3. Hypertension-well-controlled.

## 2012-10-26 NOTE — Patient Instructions (Signed)
Reduce amlodipine to 2.5 mg daily  Continue your other therapy  I will see you again in 6 months.

## 2013-04-25 ENCOUNTER — Encounter: Payer: Self-pay | Admitting: Cardiology

## 2013-04-25 ENCOUNTER — Ambulatory Visit (INDEPENDENT_AMBULATORY_CARE_PROVIDER_SITE_OTHER): Payer: Medicare Other | Admitting: Cardiology

## 2013-04-25 VITALS — BP 143/77 | HR 70 | Ht 63.0 in | Wt 147.0 lb

## 2013-04-25 DIAGNOSIS — I350 Nonrheumatic aortic (valve) stenosis: Secondary | ICD-10-CM

## 2013-04-25 DIAGNOSIS — E782 Mixed hyperlipidemia: Secondary | ICD-10-CM | POA: Insufficient documentation

## 2013-04-25 DIAGNOSIS — R55 Syncope and collapse: Secondary | ICD-10-CM | POA: Insufficient documentation

## 2013-04-25 DIAGNOSIS — I359 Nonrheumatic aortic valve disorder, unspecified: Secondary | ICD-10-CM

## 2013-04-25 DIAGNOSIS — I10 Essential (primary) hypertension: Secondary | ICD-10-CM | POA: Insufficient documentation

## 2013-04-25 NOTE — Patient Instructions (Signed)
Continue your current therapy  I will see you in 6 months.   

## 2013-04-25 NOTE — Progress Notes (Signed)
Aimee Ward Date of Birth: 1933/07/29 Medical Record #[id number]  History of Present Illness: Aimee Ward is seen for followup of history of near syncope. She has HTN and HLD. She has a history of moderate aortic stenosis. Event monitor showed few PACs and PVCs. Symptoms improved significantly with reduction in BP meds. On follow up today she is feeling very well. No dizziness unless she stands up too quickly. No palpitations. Stays active.    Current Outpatient Prescriptions  Medication Sig Dispense Refill  . ALPRAZolam (XANAX) 0.5 MG tablet Take 0.5 mg by mouth at bedtime as needed.      Marland Kitchen amLODipine (NORVASC) 2.5 MG tablet Take 1 tablet (2.5 mg total) by mouth daily.  180 tablet  3  . citalopram (CELEXA) 10 MG tablet Take 10 mg by mouth daily.      Marland Kitchen levothyroxine (SYNTHROID, LEVOTHROID) 75 MCG tablet Take 75 mcg by mouth daily.      Marland Kitchen loratadine (CLARITIN) 10 MG tablet Take 10 mg by mouth as needed.       . metoprolol (LOPRESSOR) 50 MG tablet Take 25 mg by mouth 2 (two) times daily.       . Multiple Vitamins-Minerals (PRESERVISION AREDS) CAPS Take by mouth. Take 1 tab twice a day      . omeprazole (PRILOSEC) 20 MG capsule Take 20 mg by mouth as needed.       . pravastatin (PRAVACHOL) 40 MG tablet Take 80 mg by mouth daily.        No current facility-administered medications for this visit.    Allergies  Allergen Reactions  . Hctz [Hydrochlorothiazide]     Cramps   . Lisinopril     itching  . Prednisone     Very high BP    Past Medical History  Diagnosis Date  . Anxiety   . CHF (congestive heart failure) 2006  . HTN (hypertension), benign   . Hypothyroidism   . Mixed hyperlipidemia   . Osteoarthritis   . Migraine   . PNA (pneumonia) 2006  . Murmur   . UTI (lower urinary tract infection)   . Syncope   . Aortic stenosis, moderate     Past Surgical History  Procedure Laterality Date  . Back surgery    . Foot surgery    . Thyroid surgery    . Cataract  extraction      History  Smoking status  . Never Smoker   Smokeless tobacco  . Never Used    History  Alcohol Use No    Family History  Problem Relation Age of Onset  . Diabetes    . Hypertension    . Stroke    . Arthritis    . Migraines    . Hypertension Mother   . Stroke Mother   . Diabetes Father   . Hypertension Brother   . Rheumatic fever Brother     Review of Systems: The review of systems is per the HPI.  All other systems were reviewed and are negative.  Physical Exam: BP 143/77  Pulse 70  Ht 5\' 3"  (1.6 m)  Wt 147 lb (66.679 kg)  BMI 26.05 kg/m2 Patient is very pleasant and in no acute distress. Skin is warm and dry. Color is normal.  HEENT is unremarkable. Normocephalic/atraumatic. PERRL. Sclera are nonicteric. Neck is supple. No masses. No JVD. Lungs are clear. Cardiac exam shows a regular rate and rhythm. There is a harsh Grade 2/6 outflow murmur noted of AS.  This radiates to the carotids with normal carotid upstroke. Abdomen is soft. Extremities are without edema. Gait and ROM are intact. No gross neurologic deficits noted.  LABORATORY DATA:    Echo Study :9/13: Study Conclusions  - Left ventricle: The cavity size was normal. Wall thickness was increased in a pattern of moderate LVH. There was mild focal basal hypertrophy of the septum. Systolic function was normal. The estimated ejection fraction was in the range of 55% to 60%. Wall motion was normal; there were no regional wall motion abnormalities. Doppler parameters are consistent with abnormal left ventricular relaxation (grade 1 diastolic dysfunction). Doppler parameters are consistent with high ventricular filling pressure. - Aortic valve: Valve mobility was restricted. There was moderate stenosis. Trivial regurgitation. - Mitral valve: Calcified annulus. - Left atrium: The atrium was mildly dilated    Assessment / Plan: 1. Presyncope/syncope - this appears to be related to postural  hypotension. Improved with reduction in BP meds. Continue current therapy.  2. Moderate aortic stenosis. This has not changed significantly since February 2013. Mean gradient was 22 mmHg with a peak gradient of 31 mmHg. Recommend followup echocardiogram in the fall.  3. Hypertension-well-controlled.

## 2013-05-21 ENCOUNTER — Ambulatory Visit (INDEPENDENT_AMBULATORY_CARE_PROVIDER_SITE_OTHER): Payer: Medicare Other | Admitting: Urology

## 2013-05-21 DIAGNOSIS — R300 Dysuria: Secondary | ICD-10-CM

## 2013-05-21 DIAGNOSIS — N952 Postmenopausal atrophic vaginitis: Secondary | ICD-10-CM

## 2013-05-21 DIAGNOSIS — N3020 Other chronic cystitis without hematuria: Secondary | ICD-10-CM

## 2013-08-13 ENCOUNTER — Other Ambulatory Visit: Payer: Self-pay | Admitting: Urology

## 2013-08-13 ENCOUNTER — Ambulatory Visit (INDEPENDENT_AMBULATORY_CARE_PROVIDER_SITE_OTHER): Payer: Medicare Other | Admitting: Urology

## 2013-08-13 DIAGNOSIS — N952 Postmenopausal atrophic vaginitis: Secondary | ICD-10-CM

## 2013-08-13 DIAGNOSIS — N3020 Other chronic cystitis without hematuria: Secondary | ICD-10-CM

## 2013-08-13 DIAGNOSIS — N390 Urinary tract infection, site not specified: Secondary | ICD-10-CM

## 2013-10-23 ENCOUNTER — Encounter: Payer: Self-pay | Admitting: Cardiology

## 2013-10-23 ENCOUNTER — Ambulatory Visit (INDEPENDENT_AMBULATORY_CARE_PROVIDER_SITE_OTHER): Payer: Medicare Other | Admitting: Cardiology

## 2013-10-23 VITALS — BP 120/70 | HR 73 | Ht 63.0 in | Wt 150.4 lb

## 2013-10-23 DIAGNOSIS — E782 Mixed hyperlipidemia: Secondary | ICD-10-CM

## 2013-10-23 DIAGNOSIS — I10 Essential (primary) hypertension: Secondary | ICD-10-CM

## 2013-10-23 DIAGNOSIS — I350 Nonrheumatic aortic (valve) stenosis: Secondary | ICD-10-CM

## 2013-10-23 NOTE — Patient Instructions (Signed)
Try and exercise more.   Continue your current therapy  I will see you in one year with an Echocardiogram

## 2013-10-23 NOTE — Progress Notes (Signed)
Aimee Ward Date of Birth: September 20, 1933 Medical Record #[id number]  History of Present Illness: Aimee Ward is seen for followup. She has HTN and HLD. She has a history of moderate aortic stenosis. She has a prior history of near syncope that resolved with reduction in BP meds. Event monitor showed few PACs and PVCs. On follow up today she is feeling very well. No palpitations. She is sedentary. She does note mild SOB if she over exerts.    Current Outpatient Prescriptions  Medication Sig Dispense Refill  . ALPRAZolam (XANAX) 0.5 MG tablet Take 0.5 mg by mouth at bedtime as needed.      Aimee Ward amLODipine (NORVASC) 2.5 MG tablet Take 1 tablet (2.5 mg total) by mouth daily.  180 tablet  3  . citalopram (CELEXA) 10 MG tablet Take 10 mg by mouth daily.      Aimee Ward levothyroxine (SYNTHROID, LEVOTHROID) 75 MCG tablet Take 75 mcg by mouth daily.      Aimee Ward loratadine (CLARITIN) 10 MG tablet Take 10 mg by mouth as needed.       . metoprolol succinate (TOPROL-XL) 25 MG 24 hr tablet Take 1 tablet by mouth 2 (two) times daily.      . Multiple Vitamins-Minerals (PRESERVISION AREDS) CAPS Take by mouth. Take 1 tab twice a day      . omeprazole (PRILOSEC) 20 MG capsule Take 20 mg by mouth as needed.       . pravastatin (PRAVACHOL) 40 MG tablet Take 80 mg by mouth daily.        No current facility-administered medications for this visit.    Allergies  Allergen Reactions  . Hctz [Hydrochlorothiazide]     Cramps   . Lisinopril     itching  . Prednisone     Very high BP    Past Medical History  Diagnosis Date  . Anxiety   . CHF (congestive heart failure) 2006  . HTN (hypertension), benign   . Hypothyroidism   . Mixed hyperlipidemia   . Osteoarthritis   . Migraine   . PNA (pneumonia) 2006  . Murmur   . UTI (lower urinary tract infection)   . Syncope   . Aortic stenosis, moderate     Past Surgical History  Procedure Laterality Date  . Back surgery    . Foot surgery    . Thyroid surgery    .  Cataract extraction      History  Smoking status  . Never Smoker   Smokeless tobacco  . Never Used    History  Alcohol Use No    Family History  Problem Relation Age of Onset  . Diabetes    . Hypertension    . Stroke    . Arthritis    . Migraines    . Hypertension Mother   . Stroke Mother   . Diabetes Father   . Hypertension Brother   . Rheumatic fever Brother     Review of Systems: The review of systems is per the HPI.  All other systems were reviewed and are negative.  Physical Exam: BP 120/70  Pulse 73  Ht 5\' 3"  (1.6 m)  Wt 150 lb 6.4 oz (68.221 kg)  BMI 26.65 kg/m2 Patient is very pleasant and in no acute distress. Skin is warm and dry. Color is normal.  HEENT is unremarkable. Normocephalic/atraumatic. PERRL. Sclera are nonicteric. Neck is supple. No masses. No JVD. Lungs are clear. Cardiac exam shows a regular rate and rhythm. There is a  harsh Grade 2/6 outflow murmur noted of AS. This radiates to the carotids with normal carotid upstroke. Abdomen is soft. Extremities are without edema. Gait and ROM are intact. No gross neurologic deficits noted.  LABORATORY DATA:    Echo Study :9/14: Study Conclusions  Study Conclusions  - Left ventricle: The cavity size was normal. Wall thickness was increased in a pattern of moderate LVH. There was mild focal basal hypertrophy of the septum. Systolic function was normal. The estimated ejection fraction was in the range of 55% to 60%. Wall motion was normal; there were no regional wall motion abnormalities. Doppler parameters are consistent with abnormal left ventricular relaxation (grade 1 diastolic dysfunction). Doppler parameters are consistent with high ventricular filling pressure. - Aortic valve: Valve mobility was restricted. There was moderate stenosis. Trivial regurgitation. - Mitral valve: Calcified annulus. - Left atrium: The atrium was mildly dilated. Transthoracic echocardiography. M-mode, complete  2D, spectral Doppler, and color Doppler. Height: Height: 160cm. Height: 63in. Weight: Weight: 67.6kg. Weight: 148.7lb. Body mass index: BMI: 26.4kg/m^2. Body surface area: BSA: 1.23m^2. Blood pressure: 138/72. Patient status: Outpatient. Location: [facility]  ------------------------------------------------------------  ------------------------------------------------------------ Left ventricle: The cavity size was normal. Wall thickness was increased in a pattern of moderate LVH. There was mild focal basal hypertrophy of the septum. Systolic function was normal. The estimated ejection fraction was in the range of 55% to 60%. Wall motion was normal; there were no regional wall motion abnormalities. Doppler parameters are consistent with abnormal left ventricular relaxation (grade 1 diastolic dysfunction). Doppler parameters are consistent with high ventricular filling pressure.    Ecg: NSR, normal.   Assessment / Plan: 1. Presyncope/syncope - resolved. Improved with reduction in BP meds. Continue current therapy.  2. Moderate aortic stenosis. This has not changed significantly since February 2013. Mean gradient was 22 mmHg with a peak gradient of 31 mmHg. Recommend followup echocardiogram in one year.  3. Hypertension-well-controlled.

## 2013-12-11 ENCOUNTER — Ambulatory Visit (HOSPITAL_COMMUNITY)
Admission: RE | Admit: 2013-12-11 | Discharge: 2013-12-11 | Disposition: A | Discharge status: Home / Self Care | Payer: Medicare Other | Source: Ambulatory Visit | Attending: Urology | Admitting: Urology

## 2013-12-11 DIAGNOSIS — N281 Cyst of kidney, acquired: Secondary | ICD-10-CM | POA: Diagnosis not present

## 2013-12-11 DIAGNOSIS — N390 Urinary tract infection, site not specified: Secondary | ICD-10-CM | POA: Diagnosis not present

## 2013-12-17 ENCOUNTER — Other Ambulatory Visit: Payer: Self-pay | Admitting: Urology

## 2013-12-17 ENCOUNTER — Ambulatory Visit (INDEPENDENT_AMBULATORY_CARE_PROVIDER_SITE_OTHER): Payer: Medicare Other | Admitting: Urology

## 2013-12-17 DIAGNOSIS — N3020 Other chronic cystitis without hematuria: Secondary | ICD-10-CM | POA: Diagnosis not present

## 2013-12-17 DIAGNOSIS — N952 Postmenopausal atrophic vaginitis: Secondary | ICD-10-CM | POA: Diagnosis not present

## 2013-12-19 ENCOUNTER — Ambulatory Visit (HOSPITAL_COMMUNITY)
Admission: RE | Admit: 2013-12-19 | Discharge: 2013-12-19 | Disposition: A | Discharge status: Home / Self Care | Payer: Medicare Other | Source: Ambulatory Visit | Attending: Urology | Admitting: Urology

## 2013-12-19 DIAGNOSIS — N281 Cyst of kidney, acquired: Secondary | ICD-10-CM | POA: Insufficient documentation

## 2013-12-19 DIAGNOSIS — N3941 Urge incontinence: Secondary | ICD-10-CM | POA: Insufficient documentation

## 2013-12-19 DIAGNOSIS — R300 Dysuria: Secondary | ICD-10-CM | POA: Diagnosis not present

## 2013-12-19 DIAGNOSIS — N3020 Other chronic cystitis without hematuria: Secondary | ICD-10-CM

## 2013-12-19 MED ORDER — IOHEXOL 300 MG/ML  SOLN
100.0000 mL | Freq: Once | INTRAMUSCULAR | Status: AC | PRN
  Administered 2013-12-19: 100 mL via INTRAVENOUS

## 2013-12-24 ENCOUNTER — Encounter: Payer: Self-pay | Admitting: Cardiology

## 2014-01-10 HISTORY — PX: LUMBAR FUSION: SHX111

## 2014-01-10 HISTORY — PX: COLONOSCOPY W/ POLYPECTOMY: SHX1380

## 2014-02-04 ENCOUNTER — Ambulatory Visit (INDEPENDENT_AMBULATORY_CARE_PROVIDER_SITE_OTHER): Payer: Medicare Other | Admitting: Urology

## 2014-02-04 DIAGNOSIS — R300 Dysuria: Secondary | ICD-10-CM

## 2014-02-04 DIAGNOSIS — N3020 Other chronic cystitis without hematuria: Secondary | ICD-10-CM

## 2014-02-25 ENCOUNTER — Ambulatory Visit (INDEPENDENT_AMBULATORY_CARE_PROVIDER_SITE_OTHER): Payer: Medicare Other | Admitting: Internal Medicine

## 2014-02-25 ENCOUNTER — Encounter: Payer: Self-pay | Admitting: Internal Medicine

## 2014-02-25 VITALS — BP 128/70 | HR 84 | Ht 63.0 in | Wt 147.5 lb

## 2014-02-25 DIAGNOSIS — R195 Other fecal abnormalities: Secondary | ICD-10-CM

## 2014-02-25 MED ORDER — NA SULFATE-K SULFATE-MG SULF 17.5-3.13-1.6 GM/177ML PO SOLN: ORAL | Status: DC

## 2014-02-25 NOTE — Progress Notes (Signed)
Aimee Ward 12-10-1933 [id number]  Note: This dictation was prepared with Dragon digital system. Any transcriptional errors that result from this procedure are unintentional.   History of Present Illness: This is a 81 year old white female referred by Dr.Dahlsstedt for evaluation of possible colovesicular vesicular fistula. Pt has been having recurrent urinary tract infection, pain on urination and   pneumaturia. On cystoscopy there was an abnormality in the left lateral anterior bladder wall confirmed  on CT scan of the abdomen which was done on 12/19/2013 there was a thickening and slight haziness surrounding the urinary bladder which could suggest inflammation.. Patient has a history of moderately severe diverticulosis but has never had acute diverticulitis as far as she can remember. Last colonoscopy in February 2007 for iron deficiency anemia showed moderately severe diverticulosis of the sigmoid colon and small sessile polyp in the sigmoid colon which on path report showed  polypoid mucosa. We have suggested at that time for her to undergo upper endoscopy to further evaluate iron deficiency but it never took place. I'm not sure why     Past Medical History  Diagnosis Date  . Anxiety   . CHF (congestive heart failure) 2006  . HTN (hypertension), benign   . Hypothyroidism   . Mixed hyperlipidemia   . Osteoarthritis   . Migraine   . PNA (pneumonia) 2006  . Murmur   . UTI (lower urinary tract infection)   . Syncope   . Aortic stenosis, moderate     Past Surgical History  Procedure Laterality Date  . Back surgery    . Foot surgery    . Thyroid surgery    . Cataract extraction      Allergies  Allergen Reactions  . Hctz [Hydrochlorothiazide]     Cramps   . Lisinopril     itching  . Prednisone     Very high BP    Family history and social history have been reviewed.  Review of Systems:   The remainder of the 10 point ROS is negative except as outlined in the  H&P  Physical Exam: General Appearance Well developed, in no distress Eyes  Non icteric  HEENT  Non traumatic, normocephalic  Mouth No lesion, tongue papillated, no cheilosis Neck Supple without adenopathy, thyroid not enlarged, no carotid bruits, no JVD Lungs Clear to auscultation bilaterally COR Normal S1, normal S2, regular rhythm, no murmur, quiet precordium Abdomen Soft with mild tenderness in left lower quadrant and suprapubic area. No palpable mass. Normoactive bowel sounds  Rectal Soft Hemoccult positive stool  Extremities  No pedal edema Skin No lesions Neurological Alert and oriented x 3 Psychological Normal mood and affect  Assessment and Plan:   81 year old white female with heme positive stool on my exam today. She is here for evaluation of possible colovesicular fistula based on CT scan of the abdomen and on cystoscopic findings by Dr.Dahlstedt . She has recurrent urinary tract infection and is currently on Macrobid once a day. Last colonoscopy 9 years ago. We will proceed with diagnostic colonoscopy to rule out primary colon neoplasm of the left colon and also to assess the severity of the diverticulosis . It is usually difficult  to find the communication between colon and the bladder on colonoscopy. There possibly could be some inflammatory changes surrounding the fistula track. Or  a stricture formed by chronic inflammation. I have explained and discussed the indication for colonoscopy with the patient and her daughter and they agree and want to proceed  Aimee Ward 02/25/2014

## 2014-02-25 NOTE — Patient Instructions (Addendum)
You have been scheduled for a colonoscopy. Please follow written instructions given to you at your visit today.  Please use the suprep sample kit you have been given today. If you use inhalers (even only as needed), please bring them with you on the day of your procedure.  I appreciate the opportunity to care for you.   CC: Dr. Diona Fanti, Dr. Reita Cliche

## 2014-03-07 ENCOUNTER — Encounter: Payer: Self-pay | Admitting: Internal Medicine

## 2014-03-07 ENCOUNTER — Ambulatory Visit (AMBULATORY_SURGERY_CENTER): Payer: Medicare Other | Admitting: Internal Medicine

## 2014-03-07 VITALS — BP 148/83 | HR 65 | Temp 97.1°F | Resp 19 | Ht 63.0 in | Wt 147.0 lb

## 2014-03-07 DIAGNOSIS — D123 Benign neoplasm of transverse colon: Secondary | ICD-10-CM

## 2014-03-07 DIAGNOSIS — R195 Other fecal abnormalities: Secondary | ICD-10-CM

## 2014-03-07 MED ORDER — SODIUM CHLORIDE 0.9 % IV SOLN: 500.0000 mL | INTRAVENOUS | Status: DC

## 2014-03-07 NOTE — Op Note (Signed)
[facility]  [address] [place], [id number]   COLONOSCOPY PROCEDURE REPORT  PATIENT: Aimee Ward, Aimee Ward  MR#: [id number] BIRTHDATE: 1933-08-09 , 75  yrs. old GENDER: female ENDOSCOPIST: Lafayette Dragon, MD REFERRED BY:Dr Salome Holmes Cobb,NP PROCEDURE DATE:  03/07/2014 PROCEDURE:   Colonoscopy with cold biopsy polypectomy First Screening Colonoscopy - Avg.  risk and is 50 yrs.  old or older - No.  Prior Negative Screening - Now for repeat screening. N/A  History of Adenoma - Now for follow-up colonoscopy & has been > or = to 3 yrs.  N/A  Polyps Removed Today? Yes. ASA CLASS:   Class II INDICATIONS:suspected: Vesicular fistula based on urological examination,, Recurrent urinary tract infections.  CT scan of the abdomen and pelvis in December 2015 showed tissue edema in the sigmoid colon and surrounding bladder, surrounding diverticulosis, Question of pneumaturia.   prior colonoscopy February 2007 showed severe diverticulosis with small polyp which on path report showed polypoid mucosa  MEDICATIONS: Monitored anesthesia care and Propofol 200 mg IV  DESCRIPTION OF PROCEDURE:   After the risks benefits and alternatives of the procedure were thoroughly explained, informed consent was obtained.  The digital rectal exam revealed no abnormalities of the rectum.   The LB [id number]  endoscope was introduced through the anus and advanced to the cecum, which was identified by both the appendix and ileocecal valve. No adverse events experienced.   The quality of the prep was good, using MoviPrep  The instrument was then slowly withdrawn as the colon was fully examined.      COLON FINDINGS: A sessile polyp measuring 2 mm in size was found in the transverse colon.  A polypectomy was performed with cold forceps.  The resection was complete, the polyp tissue was completely retrieved and sent to histology.   There was severe diverticulosis noted in the  sigmoid colon with associated muscular hypertrophy, colonic narrowing, angulation and tortuosity.   There was no inflammatory changes noted in the sigmoid colon.  It was severe melanosis coli extending from the rectum through the entire colon to the cecum careful examination of the sigmoid colon did not detect any evidence of fistula..  The most severe diverticulosis extended from 15-30 cm from the rectum.  Retroflexed views revealed no abnormalities. The time to cecum=5 minutes 52 seconds. Withdrawal time=11 minutes 45 seconds.  The scope was withdrawn and the procedure completed. COMPLICATIONS: There were no immediate complications.  ENDOSCOPIC IMPRESSION: 1.   Sessile polyp was found in the transverse colon; polypectomy was performed with cold forceps 2.   There was severe diverticulosis noted in the sigmoid colon ,muscular hypertrophy, angulation and decrease the luminal size, 3.   There was no inflammatory changes noted in the sigmoid colon. 4.  severe melanosis coli extending from the rectum through the entire colon to the cecum 5.careful examination of the sigmoid colon did not detect any evidence of fistula..  The most severe diverticulosis extended from 15-30 cm from the rectum ,pediatric scope  negotiated through the sigmoid colon without difficulty  RECOMMENDATIONS: 1.  Await pathology results 2.  No evidence of inflammatory changes in the sigmoid colon and no endoscopic evidence of fistula although it is very difficult to see a fistula on routine colonoscopic can exam 3.high-fiber diet and fiber supplements 4. No recall colonoscopy due to age 87.avoid herbal laxatives  eSigned:  Lafayette Dragon, MD 03/07/2014 12:28 PM   cc:   PATIENT NAME:  Aimee Ward, Aimee Ward MR#: [id number]

## 2014-03-07 NOTE — Patient Instructions (Addendum)
Colon polyp removed today, and diverticulosis seen.  Try to follow high fiber diet.  Handouts given on polyps,divertoiculosis and high fiber diet. No recall colonoscopy due to age.  Avoid herbal laxatives.  Try metamucil or benefiber. Call us with any questions or concerns. Thank you.  YOU HAD AN ENDOSCOPIC PROCEDURE TODAY AT [facility]: Refer to the procedure report that was given to you for any specific questions about what was found during the examination.  If the procedure report does not answer your questions, please call your gastroenterologist to clarify.  If you requested that your care partner not be given the details of your procedure findings, then the procedure report has been included in a sealed envelope for you to review at your convenience later.  YOU SHOULD EXPECT: Some feelings of bloating in the abdomen. Passage of more gas than usual.  Walking can help get rid of the air that was put into your GI tract during the procedure and reduce the bloating. If you had a lower endoscopy (such as a colonoscopy or flexible sigmoidoscopy) you may notice spotting of blood in your stool or on the toilet paper. If you underwent a bowel prep for your procedure, then you may not have a normal bowel movement for a few days.  DIET: Your first meal following the procedure should be a light meal and then it is ok to progress to your normal diet.  A half-sandwich or bowl of soup is an example of a good first meal.  Heavy or fried foods are harder to digest and may make you feel nauseous or bloated.  Likewise meals heavy in dairy and vegetables can cause extra gas to form and this can also increase the bloating.  Drink plenty of fluids but you should avoid alcoholic beverages for 24 hours.  ACTIVITY: Your care partner should take you home directly after the procedure.  You should plan to take it easy, moving slowly for the rest of the day.  You can resume normal activity the day after the  procedure however you should NOT DRIVE or use heavy machinery for 24 hours (because of the sedation medicines used during the test).    SYMPTOMS TO REPORT IMMEDIATELY: A gastroenterologist can be reached at any hour.  During normal business hours, 8:30 AM to 5:00 PM Monday through Friday, call [phone].  After hours and on weekends, please call the GI answering service at [phone] who will take a message and have the physician on call contact you.   Following lower endoscopy (colonoscopy or flexible sigmoidoscopy):  Excessive amounts of blood in the stool  Significant tenderness or worsening of abdominal pains  Swelling of the abdomen that is new, acute  Fever of 100F or higher  Following upper endoscopy (EGD)  Vomiting of blood or coffee ground material  New chest pain or pain under the shoulder blades  Painful or persistently difficult swallowing  New shortness of breath  Fever of 100F or higher  Black, tarry-looking stools  FOLLOW UP: If any biopsies were taken you will be contacted by phone or by letter within the next 1-3 weeks.  Call your gastroenterologist if you have not heard about the biopsies in 3 weeks.  Our staff will call the home number listed on your records the next business day following your procedure to check on you and address any questions or concerns that you may have at that time regarding the information given to you following your procedure. This  is a courtesy call and so if there is no answer at the home number and we have not heard from you through the emergency physician on call, we will assume that you have returned to your regular daily activities without incident.  SIGNATURES/CONFIDENTIALITY: You and/or your care partner have signed paperwork which will be entered into your electronic medical record.  These signatures attest to the fact that that the information above on your After Visit Summary has been reviewed and is understood.  Full  responsibility of the confidentiality of this discharge information lies with you and/or your care-partner.

## 2014-03-07 NOTE — Progress Notes (Signed)
A/ox3 pleased with MAC, report to Robbin RN 

## 2014-03-07 NOTE — Progress Notes (Signed)
Called to room to assist during endoscopic procedure.  Patient ID and intended procedure confirmed with present staff. Received instructions for my participation in the procedure from the performing physician.  

## 2014-03-10 ENCOUNTER — Telehealth: Payer: Self-pay | Admitting: *Deleted

## 2014-03-10 NOTE — Telephone Encounter (Signed)
  Follow up Call-  Call back number 03/07/2014  Post procedure Call Back phone  # [phone]  Permission to leave phone message Yes     Patient questions:  Do you have a fever, pain , or abdominal swelling? No. Pain Score  0 *  Have you tolerated food without any problems? Yes.    Have you been able to return to your normal activities? Yes.    Do you have any questions about your discharge instructions: Diet   No. Medications  No. Follow up visit  No.  Do you have questions or concerns about your Care? No.  Actions: * If pain score is 4 or above: No action needed, pain <4.

## 2014-03-11 ENCOUNTER — Encounter: Payer: Self-pay | Admitting: Internal Medicine

## 2014-07-29 ENCOUNTER — Ambulatory Visit (INDEPENDENT_AMBULATORY_CARE_PROVIDER_SITE_OTHER): Payer: Medicare Other | Admitting: Urology

## 2014-07-29 DIAGNOSIS — N390 Urinary tract infection, site not specified: Secondary | ICD-10-CM | POA: Diagnosis not present

## 2014-07-29 DIAGNOSIS — N952 Postmenopausal atrophic vaginitis: Secondary | ICD-10-CM | POA: Diagnosis not present

## 2014-08-21 ENCOUNTER — Other Ambulatory Visit: Payer: Self-pay | Admitting: Neurological Surgery

## 2014-08-28 ENCOUNTER — Encounter (HOSPITAL_COMMUNITY)
Admission: RE | Admit: 2014-08-28 | Discharge: 2014-08-28 | Disposition: A | Discharge status: Home / Self Care | Payer: Medicare Other | Source: Ambulatory Visit | Attending: Neurological Surgery | Admitting: Neurological Surgery

## 2014-08-28 ENCOUNTER — Encounter (HOSPITAL_COMMUNITY): Payer: Self-pay

## 2014-08-28 DIAGNOSIS — Z0183 Encounter for blood typing: Secondary | ICD-10-CM | POA: Diagnosis not present

## 2014-08-28 DIAGNOSIS — Z01812 Encounter for preprocedural laboratory examination: Secondary | ICD-10-CM | POA: Diagnosis not present

## 2014-08-28 DIAGNOSIS — M4310 Spondylolisthesis, site unspecified: Secondary | ICD-10-CM | POA: Insufficient documentation

## 2014-08-28 HISTORY — DX: Unspecified hearing loss, bilateral: H91.93

## 2014-08-28 HISTORY — DX: Other intervertebral disc degeneration, lumbar region: M51.36

## 2014-08-28 HISTORY — DX: Other intervertebral disc degeneration, lumbar region without mention of lumbar back pain or lower extremity pain: M51.369

## 2014-08-28 HISTORY — DX: Unspecified macular degeneration: H35.30

## 2014-08-28 HISTORY — DX: Complete loss of teeth, unspecified cause, unspecified class: K08.109

## 2014-08-28 HISTORY — DX: Other specified postprocedural states: Z98.890

## 2014-08-28 HISTORY — DX: Nausea with vomiting, unspecified: R11.2

## 2014-08-28 HISTORY — DX: Gastro-esophageal reflux disease without esophagitis: K21.9

## 2014-08-28 HISTORY — DX: Presence of dental prosthetic device (complete) (partial): Z97.2

## 2014-08-28 HISTORY — DX: Inflammatory liver disease, unspecified: K75.9

## 2014-08-28 LAB — TYPE AND SCREEN
ABO/RH(D): O POS
ANTIBODY SCREEN: NEGATIVE

## 2014-08-28 LAB — CBC
HEMATOCRIT: 38.1 % (ref 36.0–46.0)
Hemoglobin: 12.9 g/dL (ref 12.0–15.0)
MCH: 31.1 pg (ref 26.0–34.0)
MCHC: 33.9 g/dL (ref 30.0–36.0)
MCV: 91.8 fL (ref 78.0–100.0)
PLATELETS: 210 10*3/uL (ref 150–400)
RBC: 4.15 MIL/uL (ref 3.87–5.11)
RDW: 14.8 % (ref 11.5–15.5)
WBC: 4.9 10*3/uL (ref 4.0–10.5)

## 2014-08-28 LAB — COMPREHENSIVE METABOLIC PANEL
ALT: 14 U/L (ref 14–54)
AST: 31 U/L (ref 15–41)
Albumin: 3.8 g/dL (ref 3.5–5.0)
Alkaline Phosphatase: 51 U/L (ref 38–126)
Anion gap: 8 (ref 5–15)
BUN: 13 mg/dL (ref 6–20)
CHLORIDE: 106 mmol/L (ref 101–111)
CO2: 22 mmol/L (ref 22–32)
Calcium: 9.1 mg/dL (ref 8.9–10.3)
Creatinine, Ser: 1.1 mg/dL — ABNORMAL HIGH (ref 0.44–1.00)
GFR, EST AFRICAN AMERICAN: 54 mL/min — AB (ref >60)
GFR, EST NON AFRICAN AMERICAN: 46 mL/min — AB (ref >60)
Glucose, Bld: 85 mg/dL (ref 65–99)
POTASSIUM: 4.3 mmol/L (ref 3.5–5.1)
Sodium: 136 mmol/L (ref 135–145)
Total Bilirubin: 0.2 mg/dL — ABNORMAL LOW (ref 0.3–1.2)
Total Protein: 7.2 g/dL (ref 6.5–8.1)

## 2014-08-28 LAB — SURGICAL PCR SCREEN
MRSA, PCR: NEGATIVE
STAPHYLOCOCCUS AUREUS: NEGATIVE

## 2014-08-28 LAB — ABO/RH: ABO/RH(D): O POS

## 2014-08-28 NOTE — Pre-Procedure Instructions (Signed)
Aimee Ward  08/28/2014      [id number] - [place], [place] - [address] [place] [address] Phone: [phone] Fax: [phone]    Your procedure is scheduled on Tuesday, August 23.  Report to [facility] at [place].M.  Call this number if you have problems the morning of surgery:  [phone]   Remember:  Do not eat food or drink liquids after midnight.Monday night  Take these medicines the morning of surgery with A SIP OF WATER : amlodipine (Norvasc), levothyroxine (Synthroid), Metoprolol (Toprol), omeprazole (Prilosec) if needed  Stop any medications that may thin your blood such as aspirin, NSAIDS (ibuprofen, advil motrin) and fish oil. Do not take any herbal supplements.   Do not wear jewelry, make-up or nail polish.  Do not wear lotions, powders, or perfumes.  Do not wear deodorant.  Do not shave 48 hours prior to surgery.    Do not bring valuables to the hospital.  [facility] is not responsible for any belongings or valuables.  Contacts, dentures or bridgework may not be worn into surgery.  Leave your suitcase in the car.  After surgery it may be brought to your room.  For patients admitted to the hospital, discharge time will be determined by your treatment team.       Special instructions:  [facility] - Preparing for Surgery  Before surgery, you can play an important role.  Because skin is not sterile, your skin needs to be as free of germs as possible.  You can reduce the number of germs on you skin by washing with CHG (chlorahexidine gluconate) soap before surgery.  CHG is an antiseptic cleaner which kills germs and bonds with the skin to continue killing germs even after washing.  Please DO NOT use if you have an allergy to CHG or antibacterial soaps.  If your skin becomes reddened/irritated stop using the CHG and inform your nurse when you arrive at [facility]  Stay.  Do not shave (including legs and underarms) for at least 48 hours prior to the first CHG shower.  You may shave your face.  Please follow these instructions carefully:   1.  Shower with CHG Soap the night before surgery and the    morning of Surgery.  2.  If you choose to wash your hair, wash your hair first as usual with your   normal shampoo.  3.  After you shampoo, rinse your hair and body thoroughly to remove the   Shampoo.  4.  Use CHG as you would any other liquid soap.  You can apply chg directly   to the skin and wash gently with scrungie or a clean washcloth.  5.  Apply the CHG Soap to your body ONLY FROM THE NECK DOWN.   Do not use on open wounds or open sores.  Avoid contact with your eyes,  ears, mouth and genitals (private parts).  Wash genitals (private parts)  with your normal soap.  6.  Wash thoroughly, paying special attention to the area where your surgery   will be performed.  7.  Thoroughly rinse your body with warm water from the neck down.  8.  DO NOT shower/wash with your normal soap after using and rinsing off the CHG Soap.  9.  Pat yourself dry with a clean towel.            10.  Wear clean pajamas.            11.  Place clean sheets on your bed the night of your first shower and do not   sleep with pets.  Day of Surgery  Do not apply any lotions/deoderants the morning of surgery.  Please wear clean clothes to the hospital/surgery center.    Please read over the following fact sheets that you were given. Pain Booklet, Coughing and Deep Breathing, Blood Transfusion Information, MRSA Information and Surgical Site Infection Prevention

## 2014-08-28 NOTE — Progress Notes (Signed)
   08/28/14 1445  OBSTRUCTIVE SLEEP APNEA  Have you ever been diagnosed with sleep apnea through a sleep study? No  Do you snore loudly (loud enough to be heard through closed doors)?  0  Do you often feel tired, fatigued, or sleepy during the daytime? 0  Has anyone observed you stop breathing during your sleep? 0  Do you have, or are you being treated for high blood pressure? 1  BMI more than 35 kg/m2? 0  Age over 79 years old? 1  Neck circumference greater than 40 cm/16 inches? 0  Gender: 0

## 2014-09-01 MED ORDER — CEFAZOLIN SODIUM-DEXTROSE 2-3 GM-% IV SOLR
2.0000 g | INTRAVENOUS | Status: AC
  Administered 2014-09-02: 2 g via INTRAVENOUS
  Filled 2014-09-01: qty 50

## 2014-09-02 ENCOUNTER — Inpatient Hospital Stay (HOSPITAL_COMMUNITY): Payer: Medicare Other | Admitting: Anesthesiology

## 2014-09-02 ENCOUNTER — Inpatient Hospital Stay (HOSPITAL_COMMUNITY): Payer: Medicare Other

## 2014-09-02 ENCOUNTER — Encounter (HOSPITAL_COMMUNITY)
Admission: AD | Disposition: A | Discharge status: Home / Self Care | Payer: Medicare Other | Source: Ambulatory Visit | Attending: Neurological Surgery

## 2014-09-02 ENCOUNTER — Inpatient Hospital Stay (HOSPITAL_COMMUNITY)
Admission: AD | Admit: 2014-09-02 | Discharge: 2014-09-05 | DRG: 460 | Disposition: A | Discharge status: Home / Self Care | Payer: Medicare Other | Source: Ambulatory Visit | Attending: Neurological Surgery | Admitting: Neurological Surgery

## 2014-09-02 ENCOUNTER — Encounter (HOSPITAL_COMMUNITY): Payer: Self-pay | Admitting: *Deleted

## 2014-09-02 DIAGNOSIS — E780 Pure hypercholesterolemia: Secondary | ICD-10-CM | POA: Diagnosis present

## 2014-09-02 DIAGNOSIS — E039 Hypothyroidism, unspecified: Secondary | ICD-10-CM | POA: Diagnosis present

## 2014-09-02 DIAGNOSIS — K219 Gastro-esophageal reflux disease without esophagitis: Secondary | ICD-10-CM | POA: Diagnosis present

## 2014-09-02 DIAGNOSIS — H9190 Unspecified hearing loss, unspecified ear: Secondary | ICD-10-CM | POA: Diagnosis present

## 2014-09-02 DIAGNOSIS — I10 Essential (primary) hypertension: Secondary | ICD-10-CM | POA: Diagnosis present

## 2014-09-02 DIAGNOSIS — Z974 Presence of external hearing-aid: Secondary | ICD-10-CM | POA: Diagnosis not present

## 2014-09-02 DIAGNOSIS — Z981 Arthrodesis status: Secondary | ICD-10-CM | POA: Diagnosis not present

## 2014-09-02 DIAGNOSIS — F419 Anxiety disorder, unspecified: Secondary | ICD-10-CM | POA: Diagnosis present

## 2014-09-02 DIAGNOSIS — M4316 Spondylolisthesis, lumbar region: Principal | ICD-10-CM | POA: Diagnosis present

## 2014-09-02 DIAGNOSIS — R2689 Other abnormalities of gait and mobility: Secondary | ICD-10-CM | POA: Diagnosis present

## 2014-09-02 DIAGNOSIS — M4806 Spinal stenosis, lumbar region: Secondary | ICD-10-CM | POA: Diagnosis present

## 2014-09-02 DIAGNOSIS — M5416 Radiculopathy, lumbar region: Secondary | ICD-10-CM | POA: Diagnosis present

## 2014-09-02 DIAGNOSIS — M4326 Fusion of spine, lumbar region: Secondary | ICD-10-CM

## 2014-09-02 SURGERY — POSTERIOR LUMBAR FUSION 1 LEVEL
Anesthesia: General | Site: Back

## 2014-09-02 MED ORDER — FLEET ENEMA 7-19 GM/118ML RE ENEM: 1.0000 | ENEMA | Freq: Once | RECTAL | Status: DC | PRN

## 2014-09-02 MED ORDER — BISACODYL 10 MG RE SUPP: 10.0000 mg | Freq: Every day | RECTAL | Status: DC | PRN

## 2014-09-02 MED ORDER — PRAVASTATIN SODIUM 40 MG PO TABS
80.0000 mg | ORAL_TABLET | Freq: Every day | ORAL | Status: DC
  Administered 2014-09-02 – 2014-09-05 (×4): 80 mg via ORAL
  Filled 2014-09-02 (×5): qty 2

## 2014-09-02 MED ORDER — 0.9 % SODIUM CHLORIDE (POUR BTL) OPTIME
TOPICAL | Status: DC | PRN
  Administered 2014-09-02: 1000 mL

## 2014-09-02 MED ORDER — ROCURONIUM BROMIDE 50 MG/5ML IV SOLN
INTRAVENOUS | Status: AC
  Filled 2014-09-02: qty 2

## 2014-09-02 MED ORDER — EPHEDRINE SULFATE 50 MG/ML IJ SOLN
INTRAMUSCULAR | Status: AC
  Filled 2014-09-02: qty 1

## 2014-09-02 MED ORDER — HYDROMORPHONE HCL 1 MG/ML IJ SOLN
INTRAMUSCULAR | Status: AC
  Filled 2014-09-02: qty 1

## 2014-09-02 MED ORDER — PANTOPRAZOLE SODIUM 40 MG PO TBEC
40.0000 mg | DELAYED_RELEASE_TABLET | Freq: Every day | ORAL | Status: DC
  Administered 2014-09-03 – 2014-09-05 (×3): 40 mg via ORAL
  Filled 2014-09-02 (×4): qty 1

## 2014-09-02 MED ORDER — FENTANYL CITRATE (PF) 250 MCG/5ML IJ SOLN
INTRAMUSCULAR | Status: AC
  Filled 2014-09-02: qty 5

## 2014-09-02 MED ORDER — GLYCOPYRROLATE 0.2 MG/ML IJ SOLN
INTRAMUSCULAR | Status: AC
  Filled 2014-09-02: qty 4

## 2014-09-02 MED ORDER — FENTANYL CITRATE (PF) 100 MCG/2ML IJ SOLN
INTRAMUSCULAR | Status: DC | PRN
  Administered 2014-09-02 (×2): 25 ug via INTRAVENOUS
  Administered 2014-09-02 (×2): 50 ug via INTRAVENOUS
  Administered 2014-09-02 (×2): 25 ug via INTRAVENOUS
  Administered 2014-09-02: 50 ug via INTRAVENOUS
  Administered 2014-09-02: 100 ug via INTRAVENOUS
  Administered 2014-09-02: 25 ug via INTRAVENOUS
  Administered 2014-09-02: 50 ug via INTRAVENOUS
  Administered 2014-09-02: 75 ug via INTRAVENOUS

## 2014-09-02 MED ORDER — KETOROLAC TROMETHAMINE 15 MG/ML IJ SOLN
15.0000 mg | Freq: Four times a day (QID) | INTRAMUSCULAR | Status: AC
  Administered 2014-09-02 – 2014-09-03 (×5): 15 mg via INTRAVENOUS
  Filled 2014-09-02 (×4): qty 1

## 2014-09-02 MED ORDER — CEFAZOLIN SODIUM 1-5 GM-% IV SOLN
1.0000 g | Freq: Three times a day (TID) | INTRAVENOUS | Status: AC
  Administered 2014-09-02 – 2014-09-03 (×2): 1 g via INTRAVENOUS
  Filled 2014-09-02 (×2): qty 50

## 2014-09-02 MED ORDER — SODIUM CHLORIDE 0.9 % IJ SOLN
INTRAMUSCULAR | Status: AC
  Filled 2014-09-02: qty 10

## 2014-09-02 MED ORDER — LORATADINE 10 MG PO TABS
10.0000 mg | ORAL_TABLET | Freq: Every day | ORAL | Status: DC | PRN
  Filled 2014-09-02: qty 1

## 2014-09-02 MED ORDER — ACETAMINOPHEN 650 MG RE SUPP: 650.0000 mg | RECTAL | Status: DC | PRN

## 2014-09-02 MED ORDER — HYDROMORPHONE HCL 1 MG/ML IJ SOLN
0.5000 mg | INTRAMUSCULAR | Status: DC | PRN
  Administered 2014-09-02: 0.5 mg via INTRAVENOUS
  Administered 2014-09-03 – 2014-09-04 (×8): 1 mg via INTRAVENOUS
  Filled 2014-09-02 (×9): qty 1

## 2014-09-02 MED ORDER — OXYCODONE-ACETAMINOPHEN 5-325 MG PO TABS
1.0000 | ORAL_TABLET | ORAL | Status: DC | PRN
  Administered 2014-09-04 – 2014-09-05 (×4): 2 via ORAL
  Filled 2014-09-02 (×4): qty 2

## 2014-09-02 MED ORDER — LIDOCAINE HCL (CARDIAC) 20 MG/ML IV SOLN
INTRAVENOUS | Status: DC | PRN
  Administered 2014-09-02: 80 mg via INTRAVENOUS

## 2014-09-02 MED ORDER — LEVOTHYROXINE SODIUM 50 MCG PO TABS
50.0000 ug | ORAL_TABLET | Freq: Every day | ORAL | Status: DC
  Administered 2014-09-03 – 2014-09-05 (×3): 50 ug via ORAL
  Filled 2014-09-02 (×3): qty 1

## 2014-09-02 MED ORDER — ONDANSETRON HCL 4 MG/2ML IJ SOLN: 4.0000 mg | INTRAMUSCULAR | Status: DC | PRN

## 2014-09-02 MED ORDER — ACETAMINOPHEN 325 MG PO TABS: 650.0000 mg | ORAL_TABLET | ORAL | Status: DC | PRN

## 2014-09-02 MED ORDER — ONDANSETRON HCL 4 MG/2ML IJ SOLN
INTRAMUSCULAR | Status: DC | PRN
  Administered 2014-09-02 (×2): 4 mg via INTRAVENOUS

## 2014-09-02 MED ORDER — EPHEDRINE SULFATE 50 MG/ML IJ SOLN
INTRAMUSCULAR | Status: DC | PRN
  Administered 2014-09-02: 10 mg via INTRAVENOUS

## 2014-09-02 MED ORDER — SENNA 8.6 MG PO TABS
1.0000 | ORAL_TABLET | Freq: Two times a day (BID) | ORAL | Status: DC
Start: 2014-09-02 — End: 2014-09-05
  Administered 2014-09-02 – 2014-09-05 (×6): 8.6 mg via ORAL
  Filled 2014-09-02 (×6): qty 1

## 2014-09-02 MED ORDER — PHENOL 1.4 % MT LIQD: 1.0000 | OROMUCOSAL | Status: DC | PRN

## 2014-09-02 MED ORDER — KETOROLAC TROMETHAMINE 15 MG/ML IJ SOLN
INTRAMUSCULAR | Status: AC
  Filled 2014-09-02: qty 1

## 2014-09-02 MED ORDER — SODIUM CHLORIDE 0.9 % IJ SOLN
3.0000 mL | Freq: Two times a day (BID) | INTRAMUSCULAR | Status: DC
  Administered 2014-09-04 – 2014-09-05 (×3): 3 mL via INTRAVENOUS

## 2014-09-02 MED ORDER — METHOCARBAMOL 1000 MG/10ML IJ SOLN
500.0000 mg | Freq: Four times a day (QID) | INTRAMUSCULAR | Status: DC | PRN
  Administered 2014-09-02: 500 mg via INTRAVENOUS
  Filled 2014-09-02 (×2): qty 5

## 2014-09-02 MED ORDER — ONDANSETRON HCL 4 MG/2ML IJ SOLN
INTRAMUSCULAR | Status: AC
  Filled 2014-09-02: qty 8

## 2014-09-02 MED ORDER — DEXAMETHASONE SODIUM PHOSPHATE 4 MG/ML IJ SOLN
INTRAMUSCULAR | Status: AC
  Filled 2014-09-02: qty 2

## 2014-09-02 MED ORDER — DEXAMETHASONE SODIUM PHOSPHATE 4 MG/ML IJ SOLN
INTRAMUSCULAR | Status: DC | PRN
  Administered 2014-09-02: 4 mg via INTRAVENOUS

## 2014-09-02 MED ORDER — PHENYLEPHRINE HCL 10 MG/ML IJ SOLN
INTRAMUSCULAR | Status: DC | PRN
  Administered 2014-09-02: 80 ug via INTRAVENOUS
  Administered 2014-09-02: 40 ug via INTRAVENOUS
  Administered 2014-09-02: 80 ug via INTRAVENOUS

## 2014-09-02 MED ORDER — POLYETHYLENE GLYCOL 3350 17 G PO PACK: 17.0000 g | PACK | Freq: Every day | ORAL | Status: DC | PRN

## 2014-09-02 MED ORDER — PROPOFOL 10 MG/ML IV BOLUS
INTRAVENOUS | Status: DC | PRN
  Administered 2014-09-02: 10 mg via INTRAVENOUS
  Administered 2014-09-02: 150 mg via INTRAVENOUS

## 2014-09-02 MED ORDER — PHENYLEPHRINE HCL 10 MG/ML IJ SOLN
10.0000 mg | INTRAVENOUS | Status: DC | PRN
  Administered 2014-09-02: 20 ug/min via INTRAVENOUS

## 2014-09-02 MED ORDER — LACTATED RINGERS IV SOLN
INTRAVENOUS | Status: DC | PRN
  Administered 2014-09-02 (×2): via INTRAVENOUS

## 2014-09-02 MED ORDER — MENTHOL 3 MG MT LOZG: 1.0000 | LOZENGE | OROMUCOSAL | Status: DC | PRN

## 2014-09-02 MED ORDER — SODIUM CHLORIDE 0.9 % IV SOLN
INTRAVENOUS | Status: DC
  Administered 2014-09-02: via INTRAVENOUS

## 2014-09-02 MED ORDER — METHOCARBAMOL 500 MG PO TABS
500.0000 mg | ORAL_TABLET | Freq: Four times a day (QID) | ORAL | Status: DC | PRN
  Administered 2014-09-04 – 2014-09-05 (×3): 500 mg via ORAL
  Filled 2014-09-02 (×4): qty 1

## 2014-09-02 MED ORDER — ALPRAZOLAM 0.5 MG PO TABS: 0.5000 mg | ORAL_TABLET | Freq: Every evening | ORAL | Status: DC | PRN

## 2014-09-02 MED ORDER — MIDAZOLAM HCL 2 MG/2ML IJ SOLN
INTRAMUSCULAR | Status: AC
  Filled 2014-09-02: qty 4

## 2014-09-02 MED ORDER — ALUM & MAG HYDROXIDE-SIMETH 200-200-20 MG/5ML PO SUSP: 30.0000 mL | Freq: Four times a day (QID) | ORAL | Status: DC | PRN

## 2014-09-02 MED ORDER — HYDROCODONE-ACETAMINOPHEN 5-325 MG PO TABS
1.0000 | ORAL_TABLET | ORAL | Status: DC | PRN
  Administered 2014-09-02 – 2014-09-04 (×4): 2 via ORAL
  Administered 2014-09-04: 1 via ORAL
  Administered 2014-09-05 (×2): 2 via ORAL
  Filled 2014-09-02 (×2): qty 2
  Filled 2014-09-02: qty 1
  Filled 2014-09-02 (×4): qty 2

## 2014-09-02 MED ORDER — GLYCOPYRROLATE 0.2 MG/ML IJ SOLN
INTRAMUSCULAR | Status: DC | PRN
  Administered 2014-09-02: 0.6 mg via INTRAVENOUS

## 2014-09-02 MED ORDER — HYDROMORPHONE HCL 1 MG/ML IJ SOLN
0.2500 mg | INTRAMUSCULAR | Status: DC | PRN
  Administered 2014-09-02 (×4): 0.5 mg via INTRAVENOUS

## 2014-09-02 MED ORDER — SODIUM CHLORIDE 0.9 % IR SOLN
Status: DC | PRN
  Administered 2014-09-02: 500 mL

## 2014-09-02 MED ORDER — METOPROLOL SUCCINATE ER 25 MG PO TB24
25.0000 mg | ORAL_TABLET | Freq: Two times a day (BID) | ORAL | Status: DC
  Administered 2014-09-03 – 2014-09-05 (×4): 25 mg via ORAL
  Filled 2014-09-02 (×4): qty 1

## 2014-09-02 MED ORDER — PROPOFOL 10 MG/ML IV BOLUS
INTRAVENOUS | Status: AC
  Filled 2014-09-02: qty 20

## 2014-09-02 MED ORDER — DOCUSATE SODIUM 100 MG PO CAPS
100.0000 mg | ORAL_CAPSULE | Freq: Two times a day (BID) | ORAL | Status: DC
  Administered 2014-09-02 – 2014-09-05 (×6): 100 mg via ORAL
  Filled 2014-09-02 (×6): qty 1

## 2014-09-02 MED ORDER — THROMBIN 20000 UNITS EX SOLR
CUTANEOUS | Status: DC | PRN
  Administered 2014-09-02: 20 mL via TOPICAL

## 2014-09-02 MED ORDER — LIDOCAINE-EPINEPHRINE 1 %-1:100000 IJ SOLN
INTRAMUSCULAR | Status: DC | PRN
  Administered 2014-09-02: 5 mL

## 2014-09-02 MED ORDER — PHENYLEPHRINE 40 MCG/ML (10ML) SYRINGE FOR IV PUSH (FOR BLOOD PRESSURE SUPPORT)
PREFILLED_SYRINGE | INTRAVENOUS | Status: AC
  Filled 2014-09-02: qty 20

## 2014-09-02 MED ORDER — LIDOCAINE HCL (CARDIAC) 20 MG/ML IV SOLN
INTRAVENOUS | Status: AC
  Filled 2014-09-02: qty 10

## 2014-09-02 MED ORDER — PROMETHAZINE HCL 25 MG/ML IJ SOLN
6.2500 mg | INTRAMUSCULAR | Status: DC | PRN
  Administered 2014-09-02: 6.25 mg via INTRAVENOUS

## 2014-09-02 MED ORDER — AMLODIPINE BESYLATE 2.5 MG PO TABS
2.5000 mg | ORAL_TABLET | Freq: Every day | ORAL | Status: DC
  Administered 2014-09-03 – 2014-09-05 (×3): 2.5 mg via ORAL
  Filled 2014-09-02 (×4): qty 1

## 2014-09-02 MED ORDER — ROCURONIUM BROMIDE 100 MG/10ML IV SOLN
INTRAVENOUS | Status: DC | PRN
  Administered 2014-09-02: 50 mg via INTRAVENOUS
  Administered 2014-09-02: 5 mg via INTRAVENOUS

## 2014-09-02 MED ORDER — NEOSTIGMINE METHYLSULFATE 10 MG/10ML IV SOLN
INTRAVENOUS | Status: DC | PRN
  Administered 2014-09-02: 3 mg via INTRAVENOUS

## 2014-09-02 MED ORDER — SUCCINYLCHOLINE CHLORIDE 20 MG/ML IJ SOLN
INTRAMUSCULAR | Status: AC
  Filled 2014-09-02: qty 1

## 2014-09-02 MED ORDER — SCOPOLAMINE 1 MG/3DAYS TD PT72
MEDICATED_PATCH | TRANSDERMAL | Status: AC
Start: 2014-09-02 — End: 2014-09-02
  Administered 2014-09-02: 1 via TRANSDERMAL
  Filled 2014-09-02: qty 1

## 2014-09-02 MED ORDER — SODIUM CHLORIDE 0.9 % IJ SOLN: 3.0000 mL | INTRAMUSCULAR | Status: DC | PRN

## 2014-09-02 MED ORDER — BUPIVACAINE HCL (PF) 0.5 % IJ SOLN
INTRAMUSCULAR | Status: DC | PRN
  Administered 2014-09-02: 5 mL

## 2014-09-02 MED ORDER — THROMBIN 5000 UNITS EX SOLR
OROMUCOSAL | Status: DC | PRN
  Administered 2014-09-02: 10 mL via TOPICAL

## 2014-09-02 MED ORDER — PROMETHAZINE HCL 25 MG/ML IJ SOLN
INTRAMUSCULAR | Status: AC
  Filled 2014-09-02: qty 1

## 2014-09-02 MED ORDER — SODIUM CHLORIDE 0.9 % IV SOLN
INTRAVENOUS | Status: DC | PRN
  Administered 2014-09-02: 11:00:00 via INTRAVENOUS

## 2014-09-02 MED ORDER — SODIUM CHLORIDE 0.9 % IV SOLN: 250.0000 mL | INTRAVENOUS | Status: DC

## 2014-09-02 MED FILL — Heparin Sodium (Porcine) Inj 1000 Unit/ML: INTRAMUSCULAR | Qty: 30 | Status: AC

## 2014-09-02 MED FILL — Sodium Chloride IV Soln 0.9%: INTRAVENOUS | Qty: 2000 | Status: AC

## 2014-09-02 SURGICAL SUPPLY — 61 items
ADH SKN CLS APL DERMABOND .7 (GAUZE/BANDAGES/DRESSINGS) ×1
ADH SKN CLS LQ APL DERMABOND (GAUZE/BANDAGES/DRESSINGS) ×1
BAG DECANTER FOR FLEXI CONT (MISCELLANEOUS) ×3 IMPLANT
BLADE CLIPPER SURG (BLADE) IMPLANT
BONE MATRIX OSTEOCEL PRO MED (Bone Implant) ×4 IMPLANT
BUR MATCHSTICK NEURO 3.0 LAGG (BURR) ×3 IMPLANT
CAGE COROENT LRG 10X9X28-12 (Cage) ×4 IMPLANT
CANISTER SUCT 3000ML PPV (MISCELLANEOUS) ×3 IMPLANT
CONT SPEC 4OZ CLIKSEAL STRL BL (MISCELLANEOUS) ×3 IMPLANT
COVER BACK TABLE 60X90IN (DRAPES) ×3 IMPLANT
DECANTER SPIKE VIAL GLASS SM (MISCELLANEOUS) ×3 IMPLANT
DERMABOND ADHESIVE PROPEN (GAUZE/BANDAGES/DRESSINGS) ×2
DERMABOND ADVANCED (GAUZE/BANDAGES/DRESSINGS) ×2
DERMABOND ADVANCED .7 DNX12 (GAUZE/BANDAGES/DRESSINGS) ×1 IMPLANT
DERMABOND ADVANCED .7 DNX6 (GAUZE/BANDAGES/DRESSINGS) IMPLANT
DRAPE C-ARM 42X72 X-RAY (DRAPES) ×6 IMPLANT
DRAPE LAPAROTOMY 100X72X124 (DRAPES) ×3 IMPLANT
DRAPE POUCH INSTRU U-SHP 10X18 (DRAPES) ×3 IMPLANT
DRAPE PROXIMA HALF (DRAPES) IMPLANT
DURAPREP 26ML APPLICATOR (WOUND CARE) ×3 IMPLANT
ELECT REM PT RETURN 9FT ADLT (ELECTROSURGICAL) ×3
ELECTRODE REM PT RTRN 9FT ADLT (ELECTROSURGICAL) ×1 IMPLANT
GAUZE SPONGE 4X4 12PLY STRL (GAUZE/BANDAGES/DRESSINGS) ×3 IMPLANT
GAUZE SPONGE 4X4 16PLY XRAY LF (GAUZE/BANDAGES/DRESSINGS) IMPLANT
GLOVE BIOGEL PI IND STRL 8.5 (GLOVE) ×2 IMPLANT
GLOVE BIOGEL PI INDICATOR 8.5 (GLOVE) ×4
GLOVE ECLIPSE 8.5 STRL (GLOVE) ×6 IMPLANT
GLOVE EXAM NITRILE LRG STRL (GLOVE) IMPLANT
GLOVE EXAM NITRILE MD LF STRL (GLOVE) IMPLANT
GLOVE EXAM NITRILE XL STR (GLOVE) IMPLANT
GLOVE EXAM NITRILE XS STR PU (GLOVE) IMPLANT
GOWN STRL REUS W/ TWL LRG LVL3 (GOWN DISPOSABLE) IMPLANT
GOWN STRL REUS W/ TWL XL LVL3 (GOWN DISPOSABLE) IMPLANT
GOWN STRL REUS W/TWL 2XL LVL3 (GOWN DISPOSABLE) ×6 IMPLANT
GOWN STRL REUS W/TWL LRG LVL3 (GOWN DISPOSABLE)
GOWN STRL REUS W/TWL XL LVL3 (GOWN DISPOSABLE)
HEMOSTAT POWDER KIT SURGIFOAM (HEMOSTASIS) IMPLANT
KIT BASIN OR (CUSTOM PROCEDURE TRAY) ×3 IMPLANT
KIT ROOM TURNOVER OR (KITS) ×3 IMPLANT
NEEDLE HYPO 22GX1.5 SAFETY (NEEDLE) ×3 IMPLANT
NS IRRIG 1000ML POUR BTL (IV SOLUTION) ×3 IMPLANT
PACK LAMINECTOMY NEURO (CUSTOM PROCEDURE TRAY) ×3 IMPLANT
PAD ARMBOARD 7.5X6 YLW CONV (MISCELLANEOUS) ×9 IMPLANT
PATTIES SURGICAL .5 X1 (DISPOSABLE) ×3 IMPLANT
PENCIL BUTTON HOLSTER BLD 10FT (ELECTRODE) ×2 IMPLANT
ROD RELINE-O LORD 5.5X40 (Rod) ×4 IMPLANT
SCREW LOCK RELINE 5.5 TULIP (Screw) ×8 IMPLANT
SCREW RELINE-O POLY 6.5X45 (Screw) ×8 IMPLANT
SPONGE LAP 4X18 X RAY DECT (DISPOSABLE) IMPLANT
SPONGE SURGIFOAM ABS GEL 100 (HEMOSTASIS) ×3 IMPLANT
SUT VIC AB 1 CT1 18XBRD ANBCTR (SUTURE) ×1 IMPLANT
SUT VIC AB 1 CT1 8-18 (SUTURE) ×3
SUT VIC AB 2-0 CP2 18 (SUTURE) ×3 IMPLANT
SUT VIC AB 3-0 SH 8-18 (SUTURE) ×5 IMPLANT
SYR 20ML ECCENTRIC (SYRINGE) ×3 IMPLANT
SYR 3ML LL SCALE MARK (SYRINGE) ×12 IMPLANT
TOWEL OR 17X24 6PK STRL BLUE (TOWEL DISPOSABLE) ×3 IMPLANT
TOWEL OR 17X26 10 PK STRL BLUE (TOWEL DISPOSABLE) ×3 IMPLANT
TRAP SPECIMEN MUCOUS 40CC (MISCELLANEOUS) ×3 IMPLANT
TRAY FOLEY W/METER SILVER 14FR (SET/KITS/TRAYS/PACK) ×3 IMPLANT
WATER STERILE IRR 1000ML POUR (IV SOLUTION) ×3 IMPLANT

## 2014-09-02 NOTE — Progress Notes (Signed)
Pt given upper teeth, bilateral hearing aids and glasses to place back on

## 2014-09-02 NOTE — Anesthesia Postprocedure Evaluation (Signed)
Anesthesia Post Note  Patient: Aimee Ward  Procedure(s) Performed: Procedure(s) (LRB): L3-4 Posterior lumbar interbody fusion (N/A)  Anesthesia type: general  Patient location: PACU  Post pain: Pain level controlled  Post assessment: Patient's Cardiovascular Status Stable  Last Vitals:  Filed Vitals:   09/02/14 1336  BP: 116/51  Pulse: 65  Temp: 36.4 C  Resp: 19    Post vital signs: Reviewed and stable  Level of consciousness: sedated  Complications: No apparent anesthesia complications

## 2014-09-02 NOTE — Anesthesia Procedure Notes (Signed)
Procedure Name: Intubation Date/Time: 09/02/2014 7:56 AM Performed by: Merdis Delay Pre-anesthesia Checklist: Patient identified, Patient being monitored, Suction available, Emergency Drugs available and Timeout performed Patient Re-evaluated:Patient Re-evaluated prior to inductionOxygen Delivery Method: Circle system utilized Preoxygenation: Pre-oxygenation with 100% oxygen Intubation Type: IV induction Ventilation: Oral airway inserted - appropriate to patient size and Mask ventilation without difficulty Laryngoscope Size: Mac and 3 Grade View: Grade I Tube type: Oral Tube size: 7.0 mm Number of attempts: 1 Airway Equipment and Method: Stylet Placement Confirmation: ETT inserted through vocal cords under direct vision,  breath sounds checked- equal and bilateral and positive ETCO2 Secured at: 22 cm Tube secured with: Tape Dental Injury: Teeth and Oropharynx as per pre-operative assessment

## 2014-09-02 NOTE — H&P (Signed)
CHIEF COMPLAINT: Low back pain, spine pain, weakness in her left side. HISTORY OF PRESENT ILLNESS: Aimee Ward is a 81 year old, right-handed individual who tells me that she had back surgery back in 2004 by Dr. Susa Day. At that time, she underwent decompression and fusion at the L4-L5 level. She did well after her surgery but here in the last year or so she has had increasing problems with pain and weakness in both her back and her left lower extremity. She had a fall in October of last year and since that time she has noted progressive weakness in her left leg such that she was advised that she should walk with a walker but she has been using a quad cane. She was seen by Dr. Tonita Cong who suggested a visit with Dr. Irene Shipper who did an injection. She notes that for three days after the injection she felt improved; however, soon the pain and then the weakness started to recur in the left lower extremity and the back. She does feel that her back may be somewhat better since the injection but the weakness is still quite bothersome to her. It was advised that she may require some surgical intervention after an MRI was performed on the 14th of June. She is seen now for an additional opinion regarding the nature of any intervention that she may require. REVIEW OF SYSTEMS: She does note that she gets some occasional issues with her bladder on a 14-point review sheet. In addition, she has some chronic nausea, leg weakness, back pain, leg pain, arthritis, in addition to some shortness of breath, leg pain while walking, high cholesterol, heart murmur, high blood pressure, some mouth sores and sore throat, sinus problems, nasal congestion and drainage, a balance disturbance, hearing loss, and the use of a hearing aid. She also has had some thyroid disease, anxiety, depression and difficulty with coordination in her arms and her legs and difficulty with concentration at times. This is all noted on her  14-point review sheet in the office today. PAST MEDICAL HISTORY: Past medical history reveals that her general health has been good. She does have some hypertension but beyond that her general health has not been an issue.    Medications and Allergies: Her current medication lists include alprazolam, amlodipine, levothyroxine, metoprolol, omeprazole, a probiotic, vitamin A, Claritin, Estrace, pravastatin and trimethoprim. PHYSICAL EXAMINATION: On physical examination I note that she stands straight and erect; however, she walks with an antalgia involving the left lower extremity and she demonstrates a florid weakness in the tibialis anterior and in the gastrocs on that left side. In addition, she has more proximal weakness in the quadriceps, all graded at 4-/5 on the left side compared to the right side. In her lower extremities her deep tendon reflexes are absent in both the patellae and the Achilles. IMAGING STUDIES: Review of the patient's plain x-rays and MRIs demonstrate that the patient has a fusion at the L4-L5 level. She has a spondylolisthesis at L3-L4 which is rather advanced on the plain x-rays and there is approximately 3 mm of motion of the spondylolisthesis between flexion and extension. She also has some spondylytic changes at the L5-S1 level. The MRI, however, demonstrates that she has a severe spinal stenosis at the level of L3-L4. I demonstrated this particularly on the axial images as her spine gets very compressed and tight across L3-L4 and then expands to near normal dimension below that. IMPRESSION/PLAN: I indicated that given the weakness that she is experiencing along with  the degree of pain that she should undergo surgical decompression of this process and stabilization at L3-L4. I noted that at the time of surgery the screws from the L4-L5 surgery would be removed. The L4 screws would likely be replaced with screws between L3 and L4 to stabilize that joint. This does  require a fusion, much as she had before. This is a significant operation, typically taking about three hours of time in the operating room. After surgery most patients are in the hospital for three to four days during which time they start to ambulate. They may require the use of a walker at home for the first several weeks and generally during the first several weeks they require some help. Beyond that, most patients become independent in their function and their level of activity within a few weeks of the surgery. Given the degree and severity of the weakness I believe that the surgery is inevitable. I do not believe that further conservative effort, like a repeat epidural steroid injection or physical therapy, is going to yield any improvement for the long term and having discussed the situation with the patient she is admitted for surgery this am.

## 2014-09-02 NOTE — Op Note (Signed)
Date of surgery: 09/02/2014 Preoperative diagnosis: Spondylolisthesis and stenosis L3-L4, status post arthrodesis L4-L5, neurogenic claudication, lumbar radiculopathy Postoperative diagnosis: Spondylolisthesis and stenosis L3-L4, status post arthrodesis L4-L5, neurogenic claudication, lumbar radiculopathy Procedure: Removal of previous hardware at L4-L5. Decompression of L3-L4 via laminectomy and decompression of the L3 nerve root superiorly L4 nerve roots inferiorly posterior lumbar interbody arthrodesis using peek spacers local autograft and allograft pedicle screw fixation L3-L4 with posterior lateral arthrodesis using autograft and allograft  Surgeon: Kristeen Miss M.D. Assistant:Neelesh Nundkumar,MD Anesthesia: Gen. endotracheal Indications: Aimee Ward is a 81 year old individual who many years ago underwent decompression and fusion at L4-L5 for degenerative spondylolisthesis. In the last few years she's developed severe pain in her back with radiation into both lower extremities such that she can only walk very short distances. An MRI demonstrated a high-grade compression at L3-L4 and she was advised regarding surgical intervention.  The patient was brought to the operating room supine on a stretcher. After the smooth induction of general endotracheal anesthesia, she was turned prone. The back was prepped with alcohol DuraPrep and draped in a sterile fashion. Midline incision was made through her previous scar in the dissection was carried down to the lumbar dorsal fascia. The fascia was opened on either side of the midline to expose the area of the previous scar in the lateral gutters the previously placed hardware was identified. Is noted that this pedicle hardware represented some early form of likely Monarch hardware which did not have atypical Screw construct. No tools were available for loosening or removal of the screws. Therefore the rod was cut in the midsection using a long bolt  cutter and in the screws with the rod attached was removed from each of L4 and L5 on either side. Once the hardware was removed the dissection was carried to the L3-L4 region or the lateral gutters were decompressed and the laminar arch out to the region of the pars was also exposed. Transverse processes were exposed between L3 and L4 knees were packed off for later use and fusion. The L3 vertebrae and underwent bilateral laminectomy removing the entire inferior margin of the lamina out to and including the facet joint transection was occurred at the pars. The dura was identified. This was then decompressed from significant adhesions and redundant material that had exceeded itself from the facet joints itself. The path of the L4 nerve root was decompressed inferiorly and then the path of the L3 nerve root was decompressed using a 2 and 3 mm Kerrison punch carefully protect the dura and the nerve root as the decompression of the soft tissue and bony overgrowth was undertaken. The disc space was identified. This was noted to be grossly loose by carefully retracting the dura medially was able to enter the disc space and a combination of curettes rongeurs and disc shavers were then used to loosen and remove severely degenerated and desiccated disc material from the interspace at the L3-L4 level. The procedure was started on the left but then by working side to side we remove the disc from both sides completely until the endplates were identified and the endplates were then curetted to allow good bony surfaces for fusion. During this procedure was apparent that there is a auto reduction of the L3-L4 space and this was verified radiographically. The interspace was then sized for an appropriately size spacer is felt that a 10 mm tall 28 mm long 12 lordotic spacer would reduce the space and maintain adequate lordosis at the L3-L4  level. These were filled with a combination of autograft and allograft and then under  fluoroscopic guidance tamped into position. Interspace was filled with an additional 7-1/2 mL of bone graft.  At this point pedicle entry sites were then chosen at L3 and 6.5 x 45 mm pedicle screws were placed under fluoroscopic visualization. In the previously placed holes it was noted that there were screws that were placed lateral to the vertebral body edge and L5 or the entry sites were used but the screws were redirected medially to bite into the vertebral body of L4. These were also 6.5 x 45 mm screws once the screws were secured to 40 mm precontoured rods were placed between the screw heads and these were tightened then in a neutral construct. Lateral gutters were packed with the remainder of the bone and once this was accomplished hemostasis and the wound was checked and the lumbar dorsal fascia was then closed with #1 Vicryls interrupted fashion, 20 Vicryls using the 17th tissues 20 mL of half percent Marcaine was injected into the paraspinous fascia at the time of closure and 30 Vicryls used to close the subcutaneous take her skin. Dermabond was placed on the skin. Patient tolerated procedure well was returned to recovery room in stable condition. Blood loss was estimated at 650 mL, 250 mL of Cell Saver blood was returned to the patient.

## 2014-09-02 NOTE — Anesthesia Preprocedure Evaluation (Addendum)
Anesthesia Evaluation  Patient identified by MRN, date of birth, ID band Patient awake    Reviewed: Allergy & Precautions, NPO status , Patient's Chart, lab work & pertinent test results  History of Anesthesia Complications (+) PONV and history of anesthetic complications  Airway Mallampati: I  TM Distance: >3 FB Neck ROM: Full    Dental  (+) Dental Advisory Given, Edentulous Upper, Partial Upper   Pulmonary shortness of breath,    Pulmonary exam normal       Cardiovascular hypertension, +CHF - dysrhythmias Rhythm:Regular Rate:Normal + Systolic murmurs Study Conclusions  - Left ventricle: The cavity size was normal. Wall thickness was increased in a pattern of moderate LVH. There was mild focal basal hypertrophy of the septum. Systolic function was normal. The estimated ejection fraction was in the range of 55% to 60%. Wall motion was normal; there were no regional wall motion abnormalities. Doppler parameters are consistent with abnormal left ventricular relaxation (grade 1 diastolic dysfunction). Doppler parameters are consistent with high ventricular filling pressure. - Aortic valve: Valve mobility was restricted. There was moderate stenosis. Trivial regurgitation. Normal myoview 2013    Neuro/Psych  Headaches, Anxiety    GI/Hepatic Neg liver ROS, GERD-  ,  Endo/Other  Hypothyroidism   Renal/GU negative Renal ROS     Musculoskeletal   Abdominal   Peds  Hematology   Anesthesia Other Findings   Reproductive/Obstetrics                         Anesthesia Physical Anesthesia Plan  ASA: III  Anesthesia Plan: General   Post-op Pain Management:    Induction: Intravenous  Airway Management Planned: Oral ETT  Additional Equipment:   Intra-op Plan:   Post-operative Plan: Extubation in OR  Informed Consent: I have reviewed the patients History and Physical, chart,  labs and discussed the procedure including the risks, benefits and alternatives for the proposed anesthesia with the patient or authorized representative who has indicated his/her understanding and acceptance.   Dental advisory given  Plan Discussed with: CRNA, Anesthesiologist and Surgeon  Anesthesia Plan Comments:         Anesthesia Quick Evaluation

## 2014-09-02 NOTE — Progress Notes (Signed)
Utilization review completed.  

## 2014-09-02 NOTE — Transfer of Care (Signed)
Immediate Anesthesia Transfer of Care Note  Patient: Aimee Ward  Procedure(s) Performed: Procedure(s) with comments: L3-4 Posterior lumbar interbody fusion (N/A) - L3-4 Posterior lumbar interbody fusion  Patient Location: PACU  Anesthesia Type:General  Level of Consciousness: awake, alert  and oriented  Airway & Oxygen Therapy: Patient Spontanous Breathing and Patient connected to nasal cannula oxygen  Post-op Assessment: Report given to RN and Post -op Vital signs reviewed and stable  Post vital signs: Reviewed and stable  Last Vitals:  Filed Vitals:   09/02/14 0622  BP: 135/57  Pulse: 72  Temp: 36 C  Resp: 18    Complications: No apparent anesthesia complications   Pt moving all extremities and following commands appropriately. VSS.

## 2014-09-03 LAB — CBC
HEMATOCRIT: 27.9 % — AB (ref 36.0–46.0)
Hemoglobin: 9.1 g/dL — ABNORMAL LOW (ref 12.0–15.0)
MCH: 30.3 pg (ref 26.0–34.0)
MCHC: 32.6 g/dL (ref 30.0–36.0)
MCV: 93 fL (ref 78.0–100.0)
Platelets: 131 10*3/uL — ABNORMAL LOW (ref 150–400)
RBC: 3 MIL/uL — AB (ref 3.87–5.11)
RDW: 15.2 % (ref 11.5–15.5)
WBC: 5 10*3/uL (ref 4.0–10.5)

## 2014-09-03 LAB — BASIC METABOLIC PANEL
ANION GAP: 6 (ref 5–15)
BUN: 10 mg/dL (ref 6–20)
CO2: 24 mmol/L (ref 22–32)
Calcium: 7.9 mg/dL — ABNORMAL LOW (ref 8.9–10.3)
Chloride: 102 mmol/L (ref 101–111)
Creatinine, Ser: 1 mg/dL (ref 0.44–1.00)
GFR calc Af Amer: >60 mL/min (ref >60)
GFR calc non Af Amer: 52 mL/min — ABNORMAL LOW (ref >60)
GLUCOSE: 109 mg/dL — AB (ref 65–99)
POTASSIUM: 4.8 mmol/L (ref 3.5–5.1)
Sodium: 132 mmol/L — ABNORMAL LOW (ref 135–145)

## 2014-09-03 NOTE — Clinical Social Work Note (Signed)
CSW Consult Acknowledged:   CSW received a consult for SNF placement. CSW awaiting PT/OT evaluation to determine the appropriate level of care.      Earley Grobe, MSW, LCSWA [phone]  

## 2014-09-03 NOTE — Evaluation (Signed)
Physical Therapy Evaluation Patient Details Name: Aimee Ward MRN: [id number] DOB: 1933-05-11 Today's Date: 09/03/2014   History of Present Illness  81 yo female s/p L 3-4 PLIF   Clinical Impression  Patient is s/p above procedure presenting with functional limitations due to the deficits listed below (see PT Problem List). Ambulating at a min guard level this afternoon. Reports LLE pain that was present PTA has resolved post-op. Good family support and motivated to work with therapy. Plan for stair training tomorrow. Patient will benefit from skilled PT to increase their independence and safety with mobility to allow discharge to the venue listed below.       Follow Up Recommendations No PT follow up;Supervision/Assistance - 24 hour (Initially)    Equipment Recommendations  None recommended by PT    Recommendations for Other Services       Precautions / Restrictions Precautions Precautions: Back Required Braces or Orthoses: Spinal Brace Spinal Brace: Thoracolumbosacral orthotic;Applied in sitting position Restrictions Weight Bearing Restrictions: No      Mobility  Bed Mobility               General bed mobility comments: sitting in chair  Transfers Overall transfer level: Needs assistance Equipment used: Rolling walker (2 wheeled) Transfers: Sit to/from Stand Sit to Stand: Min guard         General transfer comment: Min guard for safety. VC for hand placement from recliner.  Ambulation/Gait Ambulation/Gait assistance: Min guard Ambulation Distance (Feet): 80 Feet Assistive device: Rolling walker (2 wheeled) Gait Pattern/deviations: Step-through pattern;Decreased stride length;Trunk flexed Gait velocity: decreased Gait velocity interpretation: Below normal speed for age/gender General Gait Details: Guarded but stable with RW for support. VC for safe RW use and to maintain back precautions with turning. No buckling noted.   Stairs             Wheelchair Mobility    Modified Rankin (Stroke Patients Only)       Balance Overall balance assessment: Needs assistance Sitting-balance support: No upper extremity supported;Feet supported Sitting balance-Leahy Scale: Good     Standing balance support: No upper extremity supported Standing balance-Leahy Scale: Fair                               Pertinent Vitals/Pain Pain Assessment: 0-10 Pain Score: 4  Pain Location: back Pain Descriptors / Indicators: Aching Pain Intervention(s): Monitored during session;Repositioned    Home Living Family/patient expects to be discharged to:: Private residence Living Arrangements: Children Available Help at Discharge: Family;Available 24 hours/day Type of Home: House Home Access: Stairs to enter Entrance Stairs-Rails: Right Entrance Stairs-Number of Steps: 6 Home Layout: One level Home Equipment: Clinical cytogeneticist - 2 wheels;Wheelchair - manual;Cane - quad      Prior Function Level of Independence: Independent               Hand Dominance   Dominant Hand: Right    Extremity/Trunk Assessment   Upper Extremity Assessment: Defer to OT evaluation           Lower Extremity Assessment: Generalized weakness         Communication   Communication: No difficulties  Cognition Arousal/Alertness: Awake/alert Behavior During Therapy: WFL for tasks assessed/performed Overall Cognitive Status: Within Functional Limits for tasks assessed                      General Comments General comments (skin integrity, edema, etc.): Reviewed  application of brace    Exercises        Assessment/Plan    PT Assessment Patient needs continued PT services  PT Diagnosis Difficulty walking;Abnormality of gait;Generalized weakness;Acute pain   PT Problem List Decreased strength;Decreased activity tolerance;Decreased balance;Decreased mobility;Decreased knowledge of use of DME;Decreased knowledge of  precautions;Pain  PT Treatment Interventions DME instruction;Gait training;Stair training;Functional mobility training;Therapeutic activities;Therapeutic exercise;Balance training;Neuromuscular re-education;Patient/family education;Modalities   PT Goals (Current goals can be found in the Care Plan section) Acute Rehab PT Goals Patient Stated Goal: to return home to dog max  PT Goal Formulation: With patient Time For Goal Achievement: 09/17/14 Potential to Achieve Goals: Good    Frequency Min 5X/week   Barriers to discharge        Co-evaluation               End of Session Equipment Utilized During Treatment: Gait belt;Back brace Activity Tolerance: Patient tolerated treatment well Patient left: in chair;with call bell/phone within reach;with family/visitor present;with chair alarm set Nurse Communication: Mobility status         Time: 1445-1501 PT Time Calculation (min) (ACUTE ONLY): 16 min   Charges:   PT Evaluation $Initial PT Evaluation Tier I: 1 Procedure     PT G CodesEllouise Newer 09/03/2014, 5:16 PM  Camille Bal [place], [place]

## 2014-09-03 NOTE — Progress Notes (Signed)
Patient ID: Aimee Ward, female   DOB: [birth year], 81 y.o.   MRN: [id number] Vital signs stable hgb 9.7  Motor function good  Dressing dry Mobilizing slowly   to have foley removed this morning.

## 2014-09-03 NOTE — Evaluation (Signed)
Occupational Therapy Evaluation Patient Details Name: Aimee Ward MRN: [id number] DOB: 07-16-1933 Today's Date: 09/03/2014    History of Present Illness 81 yo female s/p L 3-4 PLIF    Clinical Impression   PT admitted with L3-4 PLIF. Pt currently with functional limitiations due to the deficits listed below (see OT problem list). PTA MOD I from home. Pt will benefit from skilled OT to increase their independence and safety with adls and balance to allow discharge [place].     Follow Up Recommendations  Home health OT    Equipment Recommendations  3 in 1 bedside comode    Recommendations for Other Services       Precautions / Restrictions Precautions Precautions: Back Required Braces or Orthoses: Spinal Brace Spinal Brace: Thoracolumbosacral orthotic;Applied in sitting position Restrictions Weight Bearing Restrictions: No      Mobility Bed Mobility Overal bed mobility: Needs Assistance Bed Mobility: Supine to Sit     Supine to sit: Min assist     General bed mobility comments: educated on back precautions and needed cues for sequence with precautions  Transfers Overall transfer level: Needs assistance Equipment used: Rolling walker (2 wheeled) Transfers: Sit to/from Stand Sit to Stand: Min assist         General transfer comment: cues for hand placement    Balance Overall balance assessment: Needs assistance Sitting-balance support: Bilateral upper extremity supported;Feet supported Sitting balance-Leahy Scale: Fair     Standing balance support: Bilateral upper extremity supported;During functional activity Standing balance-Leahy Scale: Poor                              ADL Overall ADL's : Needs assistance/impaired     Grooming: Wash/dry face;Min guard;Sitting               Lower Body Dressing: Maximal assistance;Sit to/from stand   Toilet Transfer: Minimal assistance;Ambulation;BSC;RW           Functional mobility  during ADLs: Minimal assistance;Rolling walker General ADL Comments: educated on back hand out and provided education on precautions     Vision     Perception     Praxis      Pertinent Vitals/Pain Pain Assessment: 0-10 Pain Score: 7  Pain Location: back Pain Descriptors / Indicators: Aching Pain Intervention(s): Repositioned;Monitored during session;Utilized relaxation techniques (IV push)     Hand Dominance Right   Extremity/Trunk Assessment Upper Extremity Assessment Upper Extremity Assessment: Overall WFL for tasks assessed   Lower Extremity Assessment Lower Extremity Assessment: Defer to PT evaluation   Cervical / Trunk Assessment Cervical / Trunk Assessment: Other exceptions (back surg)   Communication Communication Communication: No difficulties   Cognition Arousal/Alertness: Awake/alert Behavior During Therapy: WFL for tasks assessed/performed Overall Cognitive Status: Within Functional Limits for tasks assessed                     General Comments       Exercises       Shoulder Instructions      Home Living Family/patient expects to be discharged to:: Private residence Living Arrangements: Children Available Help at Discharge: Family;Available 24 hours/day Type of Home: House Home Access: Stairs to enter     Home Layout: One level     Bathroom Shower/Tub: Occupational psychologist: Standard     Home Equipment: Clinical cytogeneticist - 2 wheels;Wheelchair - manual          Prior Functioning/Environment  Level of Independence: Independent             OT Diagnosis: Generalized weakness;Acute pain   OT Problem List: Decreased strength;Decreased activity tolerance;Impaired balance (sitting and/or standing);Decreased cognition;Decreased safety awareness;Decreased knowledge of use of DME or AE;Decreased knowledge of precautions;Pain   OT Treatment/Interventions: Self-care/ADL training;Therapeutic exercise;DME and/or AE  instruction;Therapeutic activities;Patient/family education;Balance training    OT Goals(Current goals can be found in the care plan section) Acute Rehab OT Goals Patient Stated Goal: to return home to dog max  OT Goal Formulation: With patient Time For Goal Achievement: 09/17/14 Potential to Achieve Goals: Good  OT Frequency: Min 2X/week   Barriers to D/C:            Co-evaluation              End of Session Equipment Utilized During Treatment: Gait belt;Rolling walker;Back brace Nurse Communication: Mobility status;Precautions  Activity Tolerance: Patient tolerated treatment well Patient left: in chair;with call bell/phone within reach;with family/visitor present   Time: 0830-0852 OT Time Calculation (min): 22 min Charges:  OT General Charges $OT Visit: 1 Procedure OT Evaluation $Initial OT Evaluation Tier I: 1 Procedure G-Codes:    Peri Maris September 26, 2014, 10:36 AM  Jeri Modena   OTR/L Pager: [phone] Office: [phone] .

## 2014-09-04 NOTE — Progress Notes (Signed)
Physical Therapy Treatment Patient Details Name: Aimee Ward MRN: [id number] DOB: 1933-09-24 Today's Date: 09/04/2014    History of Present Illness 81 yo female s/p L 3-4 PLIF     PT Comments    Progressing towards physical therapy goals, increasing ambulatory distance. Requires min assist today for walker control. Complains of Rt buttock pain in weight-bearing, relying heavily on RW for support. Practiced stair training with family present, demonstrating moderate difficulty but able to navigate 3 steps with min assist for support. Patient will continue to benefit from skilled physical therapy services to further improve independence with functional mobility.   Follow Up Recommendations  No PT follow up;Supervision/Assistance - 24 hour (Initially)     Equipment Recommendations  None recommended by PT    Recommendations for Other Services       Precautions / Restrictions Precautions Precautions: Back Precaution Booklet Issued: Yes (comment) Precaution Comments: Reviewed precautions Required Braces or Orthoses: Spinal Brace Spinal Brace: Thoracolumbosacral orthotic;Applied in sitting position Restrictions Weight Bearing Restrictions: No    Mobility  Bed Mobility Overal bed mobility: Needs Assistance Bed Mobility: Rolling;Sidelying to Sit Rolling: Min guard Sidelying to sit: Min guard   Sit to supine: Min assist (assist to bring feet in to bed, pt able to recall log roll technique for bed mobility)   General bed mobility comments: Min guard for safety. VC for technique throughout. Able to perform without physical assist but requires extra effort and time.  Transfers Overall transfer level: Needs assistance Equipment used: Rolling walker (2 wheeled) Transfers: Sit to/from Stand Sit to Stand: Supervision         General transfer comment: supervision for safety. Slow to rise with notable LE weakness however able to perform on her own with use of RW for  support.  Ambulation/Gait Ambulation/Gait assistance: Min assist Ambulation Distance (Feet): 115 Feet Assistive device: Rolling walker (2 wheeled) Gait Pattern/deviations: Step-through pattern;Decreased stride length;Trendelenburg;Trunk flexed;Antalgic Gait velocity: decreased   General Gait Details: Min assist intermittently today for walker control with VC to avoid bumping into objects with RW. Moderately antalgic gait pattern with Lt trendelenburg.  No buckling noted. Required several standing rest breaks to complete distance. VC for upright posture.   Stairs Stairs: Yes Stairs assistance: Min assist Stair Management: One rail Right;Step to pattern;Forwards Number of Stairs: 3 General stair comments: Attempted sideways approach, unable to perform. Required min assist for Lt hand support and use of rail with Rt hand to safely navigate stairs. VC for sequencing frequently.  Wheelchair Mobility    Modified Rankin (Stroke Patients Only)       Balance                                    Cognition Arousal/Alertness: Awake/alert Behavior During Therapy: WFL for tasks assessed/performed Overall Cognitive Status: Within Functional Limits for tasks assessed                      Exercises General Exercises - Lower Extremity Ankle Circles/Pumps: AROM;Both;10 reps;Seated Gluteal Sets: Strengthening;Both;10 reps;Seated Long Arc Quad: Strengthening;Both;10 reps;Seated    General Comments General comments (skin integrity, edema, etc.): Reviewed precautions and brace application      Pertinent Vitals/Pain Pain Assessment: Faces Pain Score: 8  Faces Pain Scale: Hurts even more Pain Location: Rt buttocks while ambulating Pain Descriptors / Indicators: Grimacing;Sharp Pain Intervention(s): Monitored during session;Repositioned    Home Living  Prior Function            PT Goals (current goals can now be found in the care  plan section) Acute Rehab PT Goals Patient Stated Goal: to return home to dog max  PT Goal Formulation: With patient Time For Goal Achievement: 09/17/14 Potential to Achieve Goals: Good Progress towards PT goals: Progressing toward goals    Frequency  Min 5X/week    PT Plan Current plan remains appropriate    Co-evaluation             End of Session Equipment Utilized During Treatment: Gait belt;Back brace Activity Tolerance: Patient tolerated treatment well Patient left: in chair;with call bell/phone within reach;with family/visitor present     Time: 3568-6168 PT Time Calculation (min) (ACUTE ONLY): 19 min  Charges:  $Gait Training: 8-22 mins                    G Codes:      Ellouise Newer 09-20-2014, 12:15 PM  Camille Bal [place], [place]

## 2014-09-04 NOTE — Progress Notes (Signed)
Occupational Therapy Treatment Patient Details Name: Aimee Ward MRN: [id number] DOB: Aug 04, 1933 Today's Date: 09/04/2014    History of present illness 81 yo female s/p L 3-4 PLIF    OT comments  Pt seen for above with focus on education on AE to assist with LB bathing and dressing tasks.  Pt able to return demonstrate use of reacher and sock aid with LB dressing with setup assist and demonstration cues.  Pt reports having AE from prior back surgery.  Ambulated short distance with RW and close supervision with pt reporting improved mobility, however limited secondary to pain.  Pt will continue to benefit from skilled OT to increase independence and safety with ADLs and balance to return home with daughter.  Follow Up Recommendations  Home health OT    Equipment Recommendations  None recommended by OT (pt has toilet riser on toilet)    Recommendations for Other Services      Precautions / Restrictions Precautions Precautions: Back Required Braces or Orthoses: Spinal Brace Spinal Brace: Thoracolumbosacral orthotic;Applied in sitting position Restrictions Weight Bearing Restrictions: No       Mobility Bed Mobility Overal bed mobility: Needs Assistance Bed Mobility: Sit to Supine       Sit to supine: Min assist (assist to bring feet in to bed, pt able to recall log roll technique for bed mobility)      Transfers Overall transfer level: Needs assistance Equipment used: Rolling walker (2 wheeled) Transfers: Sit to/from Stand Sit to Stand: Supervision;Min guard         General transfer comment: MIn guard with sit > stand, supervision with short distance ambulation and transfers        ADL Overall ADL's : Needs assistance/impaired                     Lower Body Dressing: Set up;Supervision/safety;Min guard;With adaptive equipment;Adhering to back precautions;Sit to/from stand   Toilet Transfer: Ambulation;BSC;RW;Supervision/safety            Functional mobility during ADLs: Supervision/safety;Rolling walker General ADL Comments: reiterated education on back hand out, provided education on precautions, and educated on use of AE to assist with LB bathing and dressing tasks                Cognition   Behavior During Therapy: WFL for tasks assessed/performed Overall Cognitive Status: Within Functional Limits for tasks assessed                                    Pertinent Vitals/ Pain       Pain Assessment: 0-10 Pain Score: 8  Pain Descriptors / Indicators: Aching Pain Intervention(s): Limited activity within patient's tolerance;Repositioned;Patient requesting pain meds-RN notified;Monitored during session         Frequency Min 2X/week     Progress Toward Goals  OT Goals(current goals can now be found in the care plan section)  Progress towards OT goals: Progressing toward goals     Plan Discharge plan remains appropriate       End of Session Equipment Utilized During Treatment: Gait belt;Rolling walker;Back brace   Activity Tolerance Patient tolerated treatment well   Patient Left in bed;with call bell/phone within reach;with family/visitor present   Nurse Communication Patient requests pain meds        Time: 6759-1638 OT Time Calculation (min): 31 min  Charges: OT General Charges $OT Visit: 1 Procedure OT Treatments $  Self Care/Home Management : 23-37 mins  Simonne Come, [phone] 09/04/2014, 8:58 AM

## 2014-09-04 NOTE — Clinical Social Work Note (Signed)
CSW reviewed the chart. Per PT/OT the appropriate level of care currently is home. At this time the CSW will sign off.   Adaria Hole, MSW, LCSWA [phone] 

## 2014-09-05 MED ORDER — METHOCARBAMOL 500 MG PO TABS: 500.0000 mg | ORAL_TABLET | Freq: Four times a day (QID) | ORAL | Status: DC | PRN

## 2014-09-05 MED ORDER — OXYCODONE-ACETAMINOPHEN 5-325 MG PO TABS: 1.0000 | ORAL_TABLET | ORAL | Status: DC | PRN

## 2014-09-05 NOTE — Discharge Summary (Signed)
Physician Discharge Summary  Patient ID: Aimee Ward MRN: [id number] DOB/AGE: 79-12-1933 79 y.o.  Admit date: 09/02/2014 Discharge date: 09/05/2014  Admission Diagnoses: Spondylolisthesis L3-L4 with spinal stenosis. Neurogenic claudication status post arthrodesis L4-L5  Discharge Diagnoses: Spondylolisthesis L3-L4 with spinal stenosis. Neurogenic claudication. Status post arthrodesis L4-L5. Active Problems:   Spondylolisthesis of lumbar region   Discharged Condition: good  Hospital Course: Patient was admitted to undergo surgical decompression and stabilization at L3-L4 having had previous surgery at L4-L5. She tolerated her surgery well. Her incision is clean and dry. Her motor function is intact.  Consults: None  Significant Diagnostic Studies: None  Treatments: surgery: Removal of previous hardware L4-L5. Posterior lumbar interbody arthrodesis L3-L4 with decompression of L3 and L4 nerve roots. Pedicle screw fixation L3-L4.  Discharge Exam: Blood pressure 103/49, pulse 92, temperature 98 F (36.7 C), temperature source Oral, resp. rate 18, height 5\' 3"  (1.6 m), weight 66.951 kg (147 lb 9.6 oz), SpO2 98 %. Incision is clean and dry. Motor function is intact in lower extremities. Station and gait is intact.  Disposition:  discharge home  Discharge Instructions    Call MD for:  redness, tenderness, or signs of infection (pain, swelling, redness, odor or green/yellow discharge around incision site)    Complete by:  As directed      Call MD for:  severe uncontrolled pain    Complete by:  As directed      Call MD for:  temperature >100.4    Complete by:  As directed      Diet - low sodium heart healthy    Complete by:  As directed      Discharge instructions    Complete by:  As directed   Okay to shower. Do not apply salves or appointments to incision. No heavy lifting with the upper extremities greater than 15 pounds. May resume driving when not requiring pain medication and  patient feels comfortable with doing so.     Increase activity slowly    Complete by:  As directed             Medication List    TAKE these medications        ALPRAZolam 0.5 MG tablet  Commonly known as:  XANAX  Take 0.5 mg by mouth at bedtime as needed for anxiety or sleep.     amLODipine 2.5 MG tablet  Commonly known as:  NORVASC  Take 1 tablet (2.5 mg total) by mouth daily.     desoximetasone 0.25 % cream  Commonly known as:  TOPICORT  Apply 1 application topically every other day.     levothyroxine 50 MCG tablet  Commonly known as:  SYNTHROID, LEVOTHROID  Take 50 mcg by mouth daily before breakfast.     loratadine 10 MG tablet  Commonly known as:  CLARITIN  Take 10 mg by mouth as needed for allergies.     methocarbamol 500 MG tablet  Commonly known as:  ROBAXIN  Take 1 tablet (500 mg total) by mouth every 6 (six) hours as needed for muscle spasms.     metoprolol succinate 25 MG 24 hr tablet  Commonly known as:  TOPROL-XL  Take 1 tablet by mouth 2 (two) times daily.     omeprazole 20 MG capsule  Commonly known as:  PRILOSEC  Take 20 mg by mouth as needed (reflux, heartburn).     oxyCODONE-acetaminophen 5-325 MG per tablet  Commonly known as:  PERCOCET/ROXICET  Take 1-2 tablets by mouth every  4 (four) hours as needed for moderate pain.     pravastatin 40 MG tablet  Commonly known as:  PRAVACHOL  Take 80 mg by mouth daily.     PRESERVISION AREDS Caps  Take by mouth. Take 1 tab twice a day         Signed: Earleen Newport 09/05/2014, 4:23 PM

## 2014-09-05 NOTE — Progress Notes (Signed)
Discharge instructions reviewed with patient/family. Family requested Vicodin for pain.RX obtained from MD and given to patient along with revision of s/e. All questions answered at this time. Transport home by family.   Ave Filter, RN

## 2014-09-05 NOTE — Progress Notes (Signed)
Physical Therapy Treatment Patient Details Name: Aimee Ward MRN: [id number] DOB: 1933/01/14 Today's Date: 09/05/2014    History of Present Illness 81 yo female s/p L 3-4 PLIF     PT Comments    Continues to make good progress towards PT goals. Ambulating up to 150 feet at a supervision level with a rolling walker for support demonstrating no loss of balance during bout. Still some difficulty standing but performs slowly without physical assist. Good family support. Adequate for d/c from PT standpoint.  Follow Up Recommendations  No PT follow up;Supervision/Assistance - 24 hour (Initially)     Equipment Recommendations  None recommended by PT    Recommendations for Other Services       Precautions / Restrictions Precautions Precautions: Back Precaution Comments: Reviewed precautions Required Braces or Orthoses: Spinal Brace Spinal Brace: Thoracolumbosacral orthotic;Applied in sitting position Restrictions Weight Bearing Restrictions: No    Mobility  Bed Mobility Overal bed mobility: Needs Assistance Bed Mobility: Rolling;Sidelying to Sit Rolling: Supervision Sidelying to sit: Supervision       General bed mobility comments: Used rail. better mechanics today and performed without physical assist.   Transfers Overall transfer level: Needs assistance Equipment used: Rolling walker (2 wheeled) Transfers: Sit to/from Stand Sit to Stand: Supervision         General transfer comment: Still slow to rise with notable LE weakness however progressed to full standing without having to sit back down. Correct hand placement. Supervision for safety. Improved balance once upright with hands on RW.  Ambulation/Gait Ambulation/Gait assistance: Supervision Ambulation Distance (Feet): 150 Feet Assistive device: Rolling walker (2 wheeled) Gait Pattern/deviations: Step-through pattern;Decreased stance time - left;Decreased stride length;Trunk flexed Gait velocity:  decreased   General Gait Details: Required only 1 standing rest break to complete distance today. VC for upright posture and walker placement for proximity. No buckling noted. Maintained better control of RW today and is avoiding stationary objects in hall.   Stairs         General stair comments:  (Family reports the wish to use W/c to transfer pt up stairs )  Wheelchair Mobility    Modified Rankin (Stroke Patients Only)       Balance                                    Cognition Arousal/Alertness: Awake/alert Behavior During Therapy: WFL for tasks assessed/performed Overall Cognitive Status: Within Functional Limits for tasks assessed                      Exercises      General Comments        Pertinent Vitals/Pain Pain Assessment: 0-10 Pain Score: 8  Pain Location: back Pain Descriptors / Indicators: Aching Pain Intervention(s): Monitored during session;Repositioned    Home Living                      Prior Function            PT Goals (current goals can now be found in the care plan section) Acute Rehab PT Goals Patient Stated Goal: to return home to dog max  PT Goal Formulation: With patient Time For Goal Achievement: 09/17/14 Potential to Achieve Goals: Good Progress towards PT goals: Progressing toward goals    Frequency  Min 5X/week    PT Plan Current plan remains appropriate    Co-evaluation  End of Session Equipment Utilized During Treatment: Gait belt;Back brace Activity Tolerance: Patient tolerated treatment well Patient left: in chair;with call bell/phone within reach;with family/visitor present     Time: 5001-6429 PT Time Calculation (min) (ACUTE ONLY): 13 min  Charges:  $Gait Training: 8-22 mins                    G Codes:      Ellouise Newer September 13, 2014, 4:29 PM Camille Bal [place], [place]

## 2014-09-10 ENCOUNTER — Other Ambulatory Visit: Payer: Self-pay

## 2014-09-10 DIAGNOSIS — I350 Nonrheumatic aortic (valve) stenosis: Secondary | ICD-10-CM

## 2014-10-23 ENCOUNTER — Ambulatory Visit (HOSPITAL_COMMUNITY): Payer: Medicare Other | Attending: Cardiology

## 2014-10-23 ENCOUNTER — Other Ambulatory Visit: Payer: Self-pay

## 2014-10-23 DIAGNOSIS — I517 Cardiomegaly: Secondary | ICD-10-CM | POA: Insufficient documentation

## 2014-10-23 DIAGNOSIS — I350 Nonrheumatic aortic (valve) stenosis: Secondary | ICD-10-CM | POA: Diagnosis present

## 2014-10-23 DIAGNOSIS — I351 Nonrheumatic aortic (valve) insufficiency: Secondary | ICD-10-CM | POA: Insufficient documentation

## 2014-10-23 DIAGNOSIS — I371 Nonrheumatic pulmonary valve insufficiency: Secondary | ICD-10-CM | POA: Diagnosis not present

## 2014-10-23 DIAGNOSIS — I340 Nonrheumatic mitral (valve) insufficiency: Secondary | ICD-10-CM | POA: Diagnosis not present

## 2014-10-23 DIAGNOSIS — I059 Rheumatic mitral valve disease, unspecified: Secondary | ICD-10-CM | POA: Diagnosis not present

## 2014-10-23 DIAGNOSIS — I10 Essential (primary) hypertension: Secondary | ICD-10-CM | POA: Diagnosis not present

## 2014-10-23 DIAGNOSIS — I071 Rheumatic tricuspid insufficiency: Secondary | ICD-10-CM | POA: Diagnosis not present

## 2014-10-23 DIAGNOSIS — E785 Hyperlipidemia, unspecified: Secondary | ICD-10-CM | POA: Diagnosis not present

## 2014-10-24 ENCOUNTER — Other Ambulatory Visit: Payer: Self-pay

## 2014-10-24 DIAGNOSIS — I350 Nonrheumatic aortic (valve) stenosis: Secondary | ICD-10-CM

## 2014-10-28 ENCOUNTER — Ambulatory Visit (INDEPENDENT_AMBULATORY_CARE_PROVIDER_SITE_OTHER): Payer: Medicare Other | Admitting: Urology

## 2014-10-28 DIAGNOSIS — N3020 Other chronic cystitis without hematuria: Secondary | ICD-10-CM | POA: Diagnosis not present

## 2014-10-28 DIAGNOSIS — N952 Postmenopausal atrophic vaginitis: Secondary | ICD-10-CM

## 2014-10-30 ENCOUNTER — Encounter: Payer: Self-pay | Admitting: Cardiology

## 2014-10-30 ENCOUNTER — Ambulatory Visit (INDEPENDENT_AMBULATORY_CARE_PROVIDER_SITE_OTHER): Payer: Medicare Other | Admitting: Cardiology

## 2014-10-30 VITALS — BP 124/62 | HR 77 | Ht 63.0 in | Wt 148.7 lb

## 2014-10-30 DIAGNOSIS — I10 Essential (primary) hypertension: Secondary | ICD-10-CM | POA: Diagnosis not present

## 2014-10-30 DIAGNOSIS — I350 Nonrheumatic aortic (valve) stenosis: Secondary | ICD-10-CM

## 2014-10-30 NOTE — Patient Instructions (Signed)
Continue your current therapy  I will see you in one year   

## 2014-10-30 NOTE — Progress Notes (Signed)
Aimee Ward Date of Birth: Dec 28, 1933 Medical Record #[id number]  History of Present Illness: Aimee Ward is seen for followup. She has HTN and HLD. She has a history of moderate aortic stenosis. She has a prior history of near syncope that resolved with reduction in BP meds. Event monitor showed few PACs and PVCs.  On follow up today she is feeling very well. No palpitations. She underwent extensive back surgery in August and is still recovering from that. No SOB or Chest pain.   Current Outpatient Prescriptions  Medication Sig Dispense Refill  . ALPRAZolam (XANAX) 0.5 MG tablet Take 0.5 mg by mouth at bedtime as needed for anxiety or sleep.     Marland Kitchen amLODipine (NORVASC) 2.5 MG tablet Take 1 tablet (2.5 mg total) by mouth daily. 180 tablet 3  . ESTRACE VAGINAL 0.1 MG/GM vaginal cream Apply 1 application topically 4 (four) times a week.  3  . HYDROcodone-acetaminophen (NORCO/VICODIN) 5-325 MG tablet Take 1 tablet by mouth every 6 (six) hours as needed.  0  . levothyroxine (SYNTHROID, LEVOTHROID) 50 MCG tablet Take 75 mcg by mouth daily before breakfast.     . loratadine (CLARITIN) 10 MG tablet Take 10 mg by mouth as needed for allergies.     . methocarbamol (ROBAXIN) 500 MG tablet Take 1 tablet (500 mg total) by mouth every 6 (six) hours as needed for muscle spasms. 60 tablet 3  . metoprolol succinate (TOPROL-XL) 25 MG 24 hr tablet Take 1 tablet by mouth 2 (two) times daily.    . Multiple Vitamins-Minerals (PRESERVISION AREDS) CAPS Take by mouth. Take 1 tab twice a day    . omeprazole (PRILOSEC) 20 MG capsule Take 20 mg by mouth as needed (reflux, heartburn).     . pravastatin (PRAVACHOL) 40 MG tablet Take 80 mg by mouth daily.      No current facility-administered medications for this visit.    Allergies  Allergen Reactions  . Hctz [Hydrochlorothiazide]     Cramps   . Lisinopril     itching    Past Medical History  Diagnosis Date  . Anxiety   . CHF (congestive heart  failure) ([place]) 2006  . Hypothyroidism   . Mixed hyperlipidemia   . Osteoarthritis   . Migraine   . PNA (pneumonia) 2006  . Murmur   . UTI (lower urinary tract infection)   . Syncope   . Aortic stenosis, moderate   . HTN (hypertension), benign     sees Dr Peter Martinique annually to check heart valve  . PONV (postoperative nausea and vomiting)   . GERD (gastroesophageal reflux disease)   . DDD (degenerative disc disease), lumbar   . Macular degeneration, bilateral   . Hearing loss of both ears     wears hearing aides  . Full dentures     upper  . Hepatitis 1964    Past Surgical History  Procedure Laterality Date  . Foot surgery    . Thyroid surgery    . Cataract extraction    . Eye surgery      bilateral detatched retina  . Back surgery  2004  . Colonoscopy w/ polypectomy  2016    History  Smoking status  . Never Smoker   Smokeless tobacco  . Never Used    History  Alcohol Use No    Family History  Problem Relation Age of Onset  . Diabetes    . Hypertension    . Stroke    . Arthritis    .  Migraines    . Hypertension Mother   . Stroke Mother   . Diabetes Father   . Hypertension Brother   . Rheumatic fever Brother     Review of Systems: The review of systems is per the HPI.  All other systems were reviewed and are negative.  Physical Exam: BP 124/62 mmHg  Pulse 77  Ht 5\' 3"  (1.6 m)  Wt 67.45 kg (148 lb 11.2 oz)  BMI 26.35 kg/m2 Patient is very pleasant and in no acute distress. Skin is warm and dry. Color is normal.  HEENT is unremarkable. Normocephalic/atraumatic. PERRL. Sclera are nonicteric. Neck is supple. No masses. No JVD. Lungs are clear. Cardiac exam shows a regular rate and rhythm. There is a harsh Grade 2/6 outflow murmur noted of AS. This radiates to the carotids with normal carotid upstroke. Abdomen is soft. Extremities are without edema. Gait and ROM are intact. No gross neurologic deficits noted.  LABORATORY DATA:    Echo Study  :10/23/14:  Study Conclusions  - Left ventricle: The cavity size was normal. There was moderate concentric hypertrophy. Systolic function was vigorous. The estimated ejection fraction was in the range of 65% to 70%. Wall motion was normal; there were no regional wall motion abnormalities. Doppler parameters are consistent with abnormal left ventricular relaxation (grade 1 diastolic dysfunction). Doppler parameters are consistent with elevated ventricular end-diastolic filling pressure. - Aortic valve: Valve mobility was restricted. There was moderate regurgitation. Mean gradient (S): 20 mm Hg. Peak gradient (S): 34 mm Hg. Valve area (VTI): 1.77 cm^2. Valve area (Vmax): 1.56 cm^2. Valve area (Vmean): 1.52 cm^2. - Aortic root: The aortic root was normal in size. - Mitral valve: Calcified annulus. Mildly thickened leaflets . There was trivial regurgitation. - Right ventricle: The cavity size was normal. Wall thickness was normal. Systolic function was normal. - Right atrium: The atrium was normal in size. - Tricuspid valve: There was mild regurgitation. - Pulmonic valve: There was trivial regurgitation. - Pulmonary arteries: Systolic pressure was within the normal range. - Inferior vena cava: The vessel was normal in size. - Pericardium, extracardiac: There was no pericardial effusion.  Impressions:  - When compared to the prior study from 2014 aortic stenosis remains in the moderate range.  ------------------------------------------------------------------- Labs, prior tests, procedures, and surgery: Echocardiography (September 2014).  Transthoracic echocardiography. M-mode, complete 2D, spectral Doppler, and color Doppler. Birthdate: Patient birthdate: October 25, 1933. Age: Patient is 81 yr old. Sex: Gender: female. BMI: 26 kg/m^2. Blood pressure:   128/70 Patient status: Outpatient. Study date: Study date: 10/23/2014. Study time:  01:20 PM. Location: [facility]  -------------------------------------------------------------------  ------------------------------------------------------------------- Left ventricle: The cavity size was normal. There was moderate concentric hypertrophy. Systolic function was vigorous. The estimated ejection fraction was in the range of 65% to 70%. Wall motion was normal; there were no regional wall motion abnormalities. Doppler parameters are consistent with abnormal left ventricular relaxation (grade 1 diastolic dysfunction). Doppler parameters are consistent with elevated ventricular end-diastolic filling pressure.     Ecg: today: NSR, normal. I have personally reviewed and interpreted this study.    Assessment / Plan: 1. Presyncope/syncope - resolved. Improved with reduction in BP meds. Continue current therapy.  2. Moderate aortic stenosis. This has not changed significantly since February 2013. Follow up Echo every 2 years.  3. Hypertension-well-controlled.  I will see in one year

## 2015-02-16 ENCOUNTER — Emergency Department (HOSPITAL_COMMUNITY): Payer: Medicare Other

## 2015-02-16 ENCOUNTER — Emergency Department (HOSPITAL_COMMUNITY)
Admission: EM | Admit: 2015-02-16 | Discharge: 2015-02-16 | Disposition: A | Discharge status: Home / Self Care | Payer: Medicare Other | Attending: Emergency Medicine | Admitting: Emergency Medicine

## 2015-02-16 ENCOUNTER — Encounter (HOSPITAL_COMMUNITY): Payer: Self-pay | Admitting: *Deleted

## 2015-02-16 DIAGNOSIS — Z87440 Personal history of urinary (tract) infections: Secondary | ICD-10-CM | POA: Diagnosis not present

## 2015-02-16 DIAGNOSIS — Z972 Presence of dental prosthetic device (complete) (partial): Secondary | ICD-10-CM | POA: Insufficient documentation

## 2015-02-16 DIAGNOSIS — M1990 Unspecified osteoarthritis, unspecified site: Secondary | ICD-10-CM | POA: Diagnosis not present

## 2015-02-16 DIAGNOSIS — K529 Noninfective gastroenteritis and colitis, unspecified: Secondary | ICD-10-CM | POA: Insufficient documentation

## 2015-02-16 DIAGNOSIS — I509 Heart failure, unspecified: Secondary | ICD-10-CM | POA: Diagnosis not present

## 2015-02-16 DIAGNOSIS — H9193 Unspecified hearing loss, bilateral: Secondary | ICD-10-CM | POA: Diagnosis not present

## 2015-02-16 DIAGNOSIS — F419 Anxiety disorder, unspecified: Secondary | ICD-10-CM | POA: Insufficient documentation

## 2015-02-16 DIAGNOSIS — Z974 Presence of external hearing-aid: Secondary | ICD-10-CM | POA: Insufficient documentation

## 2015-02-16 DIAGNOSIS — Z8701 Personal history of pneumonia (recurrent): Secondary | ICD-10-CM | POA: Insufficient documentation

## 2015-02-16 DIAGNOSIS — R011 Cardiac murmur, unspecified: Secondary | ICD-10-CM | POA: Insufficient documentation

## 2015-02-16 DIAGNOSIS — E782 Mixed hyperlipidemia: Secondary | ICD-10-CM | POA: Diagnosis not present

## 2015-02-16 DIAGNOSIS — E039 Hypothyroidism, unspecified: Secondary | ICD-10-CM | POA: Diagnosis not present

## 2015-02-16 DIAGNOSIS — I10 Essential (primary) hypertension: Secondary | ICD-10-CM | POA: Insufficient documentation

## 2015-02-16 DIAGNOSIS — Z79899 Other long term (current) drug therapy: Secondary | ICD-10-CM | POA: Diagnosis not present

## 2015-02-16 DIAGNOSIS — K219 Gastro-esophageal reflux disease without esophagitis: Secondary | ICD-10-CM | POA: Insufficient documentation

## 2015-02-16 DIAGNOSIS — R1012 Left upper quadrant pain: Secondary | ICD-10-CM | POA: Diagnosis present

## 2015-02-16 LAB — URINE MICROSCOPIC-ADD ON

## 2015-02-16 LAB — CBC WITH DIFFERENTIAL/PLATELET
BASOS ABS: 0 10*3/uL (ref 0.0–0.1)
Basophils Relative: 0 %
Eosinophils Absolute: 0 10*3/uL (ref 0.0–0.7)
Eosinophils Relative: 1 %
HEMATOCRIT: 44.3 % (ref 36.0–46.0)
Hemoglobin: 14.5 g/dL (ref 12.0–15.0)
LYMPHS PCT: 3 %
Lymphs Abs: 0.2 10*3/uL — ABNORMAL LOW (ref 0.7–4.0)
MCH: 29.5 pg (ref 26.0–34.0)
MCHC: 32.7 g/dL (ref 30.0–36.0)
MCV: 90.2 fL (ref 78.0–100.0)
Monocytes Absolute: 0.3 10*3/uL (ref 0.1–1.0)
Monocytes Relative: 4 %
NEUTROS ABS: 5.7 10*3/uL (ref 1.7–7.7)
NEUTROS PCT: 92 %
Platelets: 210 10*3/uL (ref 150–400)
RBC: 4.91 MIL/uL (ref 3.87–5.11)
RDW: 15.1 % (ref 11.5–15.5)
WBC: 6.1 10*3/uL (ref 4.0–10.5)

## 2015-02-16 LAB — COMPREHENSIVE METABOLIC PANEL
ALBUMIN: 3.8 g/dL (ref 3.5–5.0)
ALT: 31 U/L (ref 14–54)
AST: 29 U/L (ref 15–41)
Alkaline Phosphatase: 127 U/L — ABNORMAL HIGH (ref 38–126)
Anion gap: 10 (ref 5–15)
BUN: 16 mg/dL (ref 6–20)
CHLORIDE: 106 mmol/L (ref 101–111)
CO2: 22 mmol/L (ref 22–32)
Calcium: 9 mg/dL (ref 8.9–10.3)
Creatinine, Ser: 0.97 mg/dL (ref 0.44–1.00)
GFR calc Af Amer: >60 mL/min (ref >60)
GFR calc non Af Amer: 54 mL/min — ABNORMAL LOW (ref >60)
GLUCOSE: 137 mg/dL — AB (ref 65–99)
Potassium: 4.3 mmol/L (ref 3.5–5.1)
SODIUM: 138 mmol/L (ref 135–145)
TOTAL PROTEIN: 8.3 g/dL — AB (ref 6.5–8.1)
Total Bilirubin: 0.4 mg/dL (ref 0.3–1.2)

## 2015-02-16 LAB — URINALYSIS, ROUTINE W REFLEX MICROSCOPIC
Bilirubin Urine: NEGATIVE
Glucose, UA: NEGATIVE mg/dL
Ketones, ur: NEGATIVE mg/dL
LEUKOCYTES UA: NEGATIVE
Nitrite: NEGATIVE
PROTEIN: NEGATIVE mg/dL
Specific Gravity, Urine: 1.01 (ref 1.005–1.030)
pH: 6 (ref 5.0–8.0)

## 2015-02-16 LAB — LIPASE, BLOOD: LIPASE: 45 U/L (ref 11–51)

## 2015-02-16 MED ORDER — HYDROCODONE-ACETAMINOPHEN 5-325 MG PO TABS
1.0000 | ORAL_TABLET | Freq: Once | ORAL | Status: AC
  Administered 2015-02-16: 1 via ORAL

## 2015-02-16 MED ORDER — ONDANSETRON 4 MG PO TBDP: ORAL_TABLET | ORAL | Status: DC

## 2015-02-16 MED ORDER — DIATRIZOATE MEGLUMINE & SODIUM 66-10 % PO SOLN
ORAL | Status: AC
  Administered 2015-02-16: 15 mL
  Filled 2015-02-16: qty 30

## 2015-02-16 MED ORDER — HYDROCODONE-ACETAMINOPHEN 5-325 MG PO TABS
ORAL_TABLET | ORAL | Status: AC
  Administered 2015-02-16: 1 via ORAL
  Filled 2015-02-16: qty 1

## 2015-02-16 MED ORDER — IOHEXOL 300 MG/ML  SOLN
100.0000 mL | Freq: Once | INTRAMUSCULAR | Status: AC | PRN
  Administered 2015-02-16: 100 mL via INTRAVENOUS

## 2015-02-16 MED ORDER — DICYCLOMINE HCL 20 MG PO TABS: ORAL_TABLET | ORAL | Status: DC

## 2015-02-16 MED ORDER — HYDROMORPHONE HCL 1 MG/ML IJ SOLN
0.5000 mg | Freq: Once | INTRAMUSCULAR | Status: AC
  Administered 2015-02-16: 0.5 mg via INTRAVENOUS
  Filled 2015-02-16: qty 1

## 2015-02-16 MED ORDER — ONDANSETRON HCL 4 MG/2ML IJ SOLN
4.0000 mg | Freq: Once | INTRAMUSCULAR | Status: AC
  Administered 2015-02-16: 4 mg via INTRAVENOUS
  Filled 2015-02-16: qty 2

## 2015-02-16 MED ORDER — SODIUM CHLORIDE 0.9 % IV BOLUS (SEPSIS)
500.0000 mL | Freq: Once | INTRAVENOUS | Status: AC
  Administered 2015-02-16: 500 mL via INTRAVENOUS

## 2015-02-16 MED ORDER — SODIUM CHLORIDE 0.9 % IV BOLUS (SEPSIS)
1000.0000 mL | Freq: Once | INTRAVENOUS | Status: AC
  Administered 2015-02-16: 1000 mL via INTRAVENOUS

## 2015-02-16 NOTE — ED Notes (Signed)
Pt complaining of headache, HR continues to be elevated, MD aware orders given

## 2015-02-16 NOTE — ED Notes (Signed)
Pt vomiting at present, told to wait 10 min to drink contrast, after zofran

## 2015-02-16 NOTE — ED Provider Notes (Signed)
CSN: [id number]     Arrival date & time 02/16/15  1110 History  By signing my name below, I, Hilda Lias, attest that this documentation has been prepared under the direction and in the presence of Milton Ferguson, MD. Electronically Signed: Hilda Lias, ED Scribe. 02/16/2015. 12:17 PM.    Chief Complaint  Patient presents with  . Abdominal Pain      Patient is a 82 y.o. female presenting with abdominal pain. The history is provided by the patient. No language interpreter was used.  Abdominal Pain Pain location:  LUQ Pain radiates to:  Does not radiate Pain severity:  Moderate Onset quality:  Gradual Duration:  3 days Timing:  Constant Progression:  Worsening Chronicity:  New Context: not previous surgeries   Relieved by:  Nothing Worsened by:  Nothing tried Ineffective treatments:  None tried Associated symptoms: diarrhea and vomiting   Associated symptoms: no chest pain, no cough, no fatigue and no hematuria    HPI Comments: Aimee Ward is a 82 y.o. female who presents to the Emergency Department complaining of constant, worsening LUQ abdominal pain that has been present for a few days. Pt reports she has also had associated intermittent diarrhea and vomiting that has been present for a few days as well. Pt reports last night she vomited several times and could not sleep because of it. Pt denies any blood in her vomit or diarrhea. Pt denies any previous surgeries on her abdomen.   Past Medical History  Diagnosis Date  . Anxiety   . Mixed hyperlipidemia   . Osteoarthritis   . Migraine   . PNA (pneumonia) 2006  . Murmur   . UTI (lower urinary tract infection)   . Syncope   . Aortic stenosis, moderate   . HTN (hypertension), benign     sees Dr Peter Martinique annually to check heart valve  . PONV (postoperative nausea and vomiting)   . GERD (gastroesophageal reflux disease)   . DDD (degenerative disc disease), lumbar   . Macular degeneration, bilateral   . Hearing  loss of both ears     wears hearing aides  . Full dentures     upper  . Hepatitis 1964  . Hypothyroidism   . CHF (congestive heart failure) ([employer]) 2006   Past Surgical History  Procedure Laterality Date  . Foot surgery    . Thyroid surgery    . Cataract extraction    . Eye surgery      bilateral detatched retina  . Back surgery  2004  . Colonoscopy w/ polypectomy  2016   Family History  Problem Relation Age of Onset  . Diabetes    . Hypertension    . Stroke    . Arthritis    . Migraines    . Hypertension Mother   . Stroke Mother   . Diabetes Father   . Hypertension Brother   . Rheumatic fever Brother    Social History  Substance Use Topics  . Smoking status: Never Smoker   . Smokeless tobacco: Never Used  . Alcohol Use: No   OB History    No data available     Review of Systems  Constitutional: Negative for appetite change and fatigue.  HENT: Negative for congestion, ear discharge and sinus pressure.   Eyes: Negative for discharge.  Respiratory: Negative for cough.   Cardiovascular: Negative for chest pain.  Gastrointestinal: Positive for vomiting, abdominal pain and diarrhea.  Genitourinary: Negative for frequency and hematuria.  Musculoskeletal:  Negative for back pain.  Skin: Negative for rash.  Neurological: Negative for seizures and headaches.  Psychiatric/Behavioral: Negative for hallucinations.  All other systems reviewed and are negative.     Allergies  Hctz and Lisinopril  Home Medications   Prior to Admission medications   Medication Sig Start Date End Date Taking? Authorizing Provider  ALPRAZolam Duanne Moron) 0.5 MG tablet Take 0.5 mg by mouth at bedtime as needed for anxiety or sleep.     Historical Provider, MD  amLODipine (NORVASC) 2.5 MG tablet Take 1 tablet (2.5 mg total) by mouth daily. 10/26/12   Peter M Martinique, MD  ESTRACE VAGINAL 0.1 MG/GM vaginal cream Apply 1 application topically 4 (four) times a week. 07/29/14   Historical Provider,  MD  HYDROcodone-acetaminophen (NORCO/VICODIN) 5-325 MG tablet Take 1 tablet by mouth every 6 (six) hours as needed. 09/26/14   Historical Provider, MD  levothyroxine (SYNTHROID, LEVOTHROID) 50 MCG tablet Take 75 mcg by mouth daily before breakfast.  01/28/14   Historical Provider, MD  loratadine (CLARITIN) 10 MG tablet Take 10 mg by mouth as needed for allergies.     Historical Provider, MD  methocarbamol (ROBAXIN) 500 MG tablet Take 1 tablet (500 mg total) by mouth every 6 (six) hours as needed for muscle spasms. 09/05/14   Kristeen Miss, MD  metoprolol succinate (TOPROL-XL) 25 MG 24 hr tablet Take 1 tablet by mouth 2 (two) times daily. 08/22/13   Historical Provider, MD  Multiple Vitamins-Minerals (PRESERVISION AREDS) CAPS Take by mouth. Take 1 tab twice a day    Historical Provider, MD  omeprazole (PRILOSEC) 20 MG capsule Take 20 mg by mouth as needed (reflux, heartburn).     Historical Provider, MD  pravastatin (PRAVACHOL) 40 MG tablet Take 80 mg by mouth daily.     Historical Provider, MD   BP 107/59 mmHg  Pulse 139  Temp(Src) 100.4 F (38 C) (Tympanic)  Resp 16  Ht 5' 3.5" (1.613 m)  Wt 146 lb (66.225 kg)  BMI 25.45 kg/m2  SpO2 97% Physical Exam  Constitutional: She is oriented to person, place, and time. She appears well-developed.  HENT:  Head: Normocephalic.  Eyes: Conjunctivae and EOM are normal. No scleral icterus.  Neck: Neck supple. No thyromegaly present.  Cardiovascular: Normal rate and regular rhythm.  Exam reveals no gallop and no friction rub.   No murmur heard. Pulmonary/Chest: No stridor. She has no wheezes. She has no rales. She exhibits no tenderness.  Abdominal: She exhibits no distension. There is tenderness. There is no rebound.  Mild LUQ tenderness  Musculoskeletal: Normal range of motion. She exhibits no edema.  Lymphadenopathy:    She has no cervical adenopathy.  Neurological: She is oriented to person, place, and time. She exhibits normal muscle tone.  Coordination normal.  Skin: No rash noted. No erythema.  Psychiatric: She has a normal mood and affect. Her behavior is normal.    ED Course  Procedures (including critical care time)  DIAGNOSTIC STUDIES: Oxygen Saturation is 97% on room air, normal by my interpretation.    COORDINATION OF CARE: 12:14 PM Discussed treatment plan with pt at bedside and pt agreed to plan.   Labs Review Labs Reviewed  CBC WITH DIFFERENTIAL/PLATELET - Abnormal; Notable for the following:    Lymphs Abs 0.2 (*)    All other components within normal limits  LIPASE, BLOOD  COMPREHENSIVE METABOLIC PANEL  URINALYSIS, ROUTINE W REFLEX MICROSCOPIC (NOT AT [facility])    Imaging Review No results found. I have personally  reviewed and evaluated these images and lab results as part of my medical decision-making.   EKG Interpretation None      MDM   Final diagnoses:  None   Patient with gastroenteritis and dehydration. CT scan the abdomen unremarkable. Patient was tachycardic initially but this improved with a liter and half of saline. Patient will be sent home with Zofran and Bentyl will drink much fluids and follow-up with her PCP this week The chart was scribed for me under my direct supervision.  I personally performed the history, physical, and medical decision making and all procedures in the evaluation of this patient.Milton Ferguson, MD 02/16/15 [phone]

## 2015-02-16 NOTE — ED Notes (Signed)
Pt reports abd pain with v/d x few days. Unsure of fever.

## 2015-02-16 NOTE — Discharge Instructions (Signed)
Drink plenty of fluids take Imodium for diarrhea follow-up with your doctor this week as planned return if any problems

## 2015-02-16 NOTE — ED Notes (Signed)
Patient given discharge instruction, verbalized understand. IV removed, band aid applied. Patient ambulatory out of the department.  

## 2015-04-04 ENCOUNTER — Encounter (HOSPITAL_COMMUNITY): Payer: Self-pay | Admitting: Emergency Medicine

## 2015-04-04 ENCOUNTER — Other Ambulatory Visit: Payer: Self-pay

## 2015-04-04 ENCOUNTER — Emergency Department (HOSPITAL_COMMUNITY)
Admission: EM | Admit: 2015-04-04 | Discharge: 2015-04-04 | Disposition: A | Discharge status: Home / Self Care | Payer: Medicare Other | Attending: Emergency Medicine | Admitting: Emergency Medicine

## 2015-04-04 DIAGNOSIS — N390 Urinary tract infection, site not specified: Secondary | ICD-10-CM | POA: Insufficient documentation

## 2015-04-04 DIAGNOSIS — I509 Heart failure, unspecified: Secondary | ICD-10-CM | POA: Insufficient documentation

## 2015-04-04 DIAGNOSIS — I110 Hypertensive heart disease with heart failure: Secondary | ICD-10-CM | POA: Diagnosis not present

## 2015-04-04 DIAGNOSIS — R42 Dizziness and giddiness: Secondary | ICD-10-CM

## 2015-04-04 DIAGNOSIS — M1990 Unspecified osteoarthritis, unspecified site: Secondary | ICD-10-CM | POA: Diagnosis not present

## 2015-04-04 DIAGNOSIS — Z79899 Other long term (current) drug therapy: Secondary | ICD-10-CM | POA: Insufficient documentation

## 2015-04-04 LAB — URINALYSIS, ROUTINE W REFLEX MICROSCOPIC
Bilirubin Urine: NEGATIVE
Glucose, UA: NEGATIVE mg/dL
Ketones, ur: NEGATIVE mg/dL
NITRITE: NEGATIVE
PH: 6 (ref 5.0–8.0)
Protein, ur: NEGATIVE mg/dL
SPECIFIC GRAVITY, URINE: 1.01 (ref 1.005–1.030)

## 2015-04-04 LAB — BASIC METABOLIC PANEL
ANION GAP: 8 (ref 5–15)
BUN: 11 mg/dL (ref 6–20)
CO2: 22 mmol/L (ref 22–32)
Calcium: 8.9 mg/dL (ref 8.9–10.3)
Chloride: 106 mmol/L (ref 101–111)
Creatinine, Ser: 0.73 mg/dL (ref 0.44–1.00)
GFR calc Af Amer: >60 mL/min (ref >60)
GLUCOSE: 117 mg/dL — AB (ref 65–99)
POTASSIUM: 3.9 mmol/L (ref 3.5–5.1)
SODIUM: 136 mmol/L (ref 135–145)

## 2015-04-04 LAB — CBC
HEMATOCRIT: 35.7 % — AB (ref 36.0–46.0)
HEMOGLOBIN: 11.8 g/dL — AB (ref 12.0–15.0)
MCH: 29.3 pg (ref 26.0–34.0)
MCHC: 33.1 g/dL (ref 30.0–36.0)
MCV: 88.6 fL (ref 78.0–100.0)
Platelets: 203 10*3/uL (ref 150–400)
RBC: 4.03 MIL/uL (ref 3.87–5.11)
RDW: 14 % (ref 11.5–15.5)
WBC: 5.2 10*3/uL (ref 4.0–10.5)

## 2015-04-04 LAB — URINE MICROSCOPIC-ADD ON

## 2015-04-04 MED ORDER — MECLIZINE HCL 12.5 MG PO TABS
25.0000 mg | ORAL_TABLET | Freq: Once | ORAL | Status: AC
  Administered 2015-04-04: 25 mg via ORAL
  Filled 2015-04-04: qty 2

## 2015-04-04 MED ORDER — MECLIZINE HCL 25 MG PO TABS: 25.0000 mg | ORAL_TABLET | Freq: Three times a day (TID) | ORAL | Status: DC | PRN

## 2015-04-04 MED ORDER — CEPHALEXIN 500 MG PO CAPS: 500.0000 mg | ORAL_CAPSULE | Freq: Four times a day (QID) | ORAL | Status: DC

## 2015-04-04 NOTE — ED Provider Notes (Signed)
CSN: [id number]     Arrival date & time 04/04/15  1125 History   First MD Initiated Contact with Patient 04/04/15 1504     Chief Complaint  Patient presents with  . Dizziness     (Consider location/radiation/quality/duration/timing/severity/associated sxs/prior Treatment) HPI... Sense of dizziness earlier today worse with bending over. Patient on city gait. She has been treated recently for a UTI. Decreased appetite. She is ambulatory but feels off-balance. Otherwise no gross neurological deficits. No confusion, stiff neck, mental status changes, slurred speech, arm or leg weakness. Severity of symptoms is moderate.  Past Medical History  Diagnosis Date  . Anxiety   . Mixed hyperlipidemia   . Osteoarthritis   . Migraine   . PNA (pneumonia) 2006  . Murmur   . UTI (lower urinary tract infection)   . Syncope   . Aortic stenosis, moderate   . HTN (hypertension), benign     sees Dr Peter Martinique annually to check heart valve  . PONV (postoperative nausea and vomiting)   . GERD (gastroesophageal reflux disease)   . DDD (degenerative disc disease), lumbar   . Macular degeneration, bilateral   . Hearing loss of both ears     wears hearing aides  . Full dentures     upper  . Hepatitis 1964  . Hypothyroidism   . CHF (congestive heart failure) ([facility]) 2006   Past Surgical History  Procedure Laterality Date  . Foot surgery    . Thyroid surgery    . Cataract extraction    . Eye surgery      bilateral detatched retina  . Back surgery  2004  . Colonoscopy w/ polypectomy  2016   Family History  Problem Relation Age of Onset  . Diabetes    . Hypertension    . Stroke    . Arthritis    . Migraines    . Hypertension Mother   . Stroke Mother   . Diabetes Father   . Hypertension Brother   . Rheumatic fever Brother    Social History  Substance Use Topics  . Smoking status: Never Smoker   . Smokeless tobacco: Never Used  . Alcohol Use: No   OB History    Gravida Para Term  Preterm AB TAB SAB Ectopic Multiple Living   4 4 4       4      Review of Systems  All other systems reviewed and are negative.     Allergies  Hctz and Lisinopril  Home Medications   Prior to Admission medications   Medication Sig Start Date End Date Taking? Authorizing Provider  ALPRAZolam Duanne Moron) 0.5 MG tablet Take 0.25 mg by mouth at bedtime as needed for anxiety or sleep.    Yes Historical Provider, MD  amLODipine (NORVASC) 2.5 MG tablet Take 1 tablet (2.5 mg total) by mouth daily. 10/26/12  Yes Peter M Martinique, MD  ciprofloxacin (CIPRO) 500 MG tablet Take 500 mg by mouth 2 (two) times daily.   Yes Historical Provider, MD  escitalopram (LEXAPRO) 10 MG tablet Take 10 mg by mouth daily.   Yes Historical Provider, MD  levothyroxine (SYNTHROID, LEVOTHROID) 88 MCG tablet Take 88 mcg by mouth daily before breakfast.   Yes Historical Provider, MD  loratadine (CLARITIN) 10 MG tablet Take 10 mg by mouth daily.    Yes Historical Provider, MD  methocarbamol (ROBAXIN) 500 MG tablet Take 1 tablet (500 mg total) by mouth every 6 (six) hours as needed for muscle spasms. Patient taking  differently: Take 500 mg by mouth 2 (two) times daily.  09/05/14  Yes Kristeen Miss, MD  metoprolol succinate (TOPROL-XL) 25 MG 24 hr tablet Take 1 tablet by mouth 2 (two) times daily. 08/22/13  Yes Historical Provider, MD  Multiple Vitamins-Minerals (PRESERVISION AREDS) CAPS Take 1 capsule by mouth daily. Take 1 tab twice a day   Yes Historical Provider, MD  omeprazole (PRILOSEC) 20 MG capsule Take 20 mg by mouth daily.    Yes Historical Provider, MD  pravastatin (PRAVACHOL) 40 MG tablet Take 80 mg by mouth daily.    Yes Historical Provider, MD  dicyclomine (BENTYL) 20 MG tablet Take one every 6 hours for abd cramps Patient taking differently: Take 20 mg by mouth. Take one every 6 hours for abd cramps 02/16/15   Milton Ferguson, MD  [facility] VAGINAL 0.1 MG/GM vaginal cream Apply 1 application topically every other day.   07/29/14   Historical Provider, MD  HYDROcodone-acetaminophen (NORCO/VICODIN) 5-325 MG tablet Take 1 tablet by mouth every 6 (six) hours as needed. 09/26/14   Historical Provider, MD  meclizine (ANTIVERT) 25 MG tablet Take 1 tablet (25 mg total) by mouth 3 (three) times daily as needed for dizziness. 04/04/15   Nat Christen, MD  ondansetron (ZOFRAN ODT) 4 MG disintegrating tablet 4mg  ODT q4 hours prn nausea/vomit 02/16/15   Milton Ferguson, MD   BP 140/73 mmHg  Pulse 68  Temp(Src) 98 F (36.7 C) (Oral)  Resp 14  Ht 5\' 3"  (1.6 m)  Wt 147 lb (66.679 kg)  BMI 26.05 kg/m2  SpO2 100% Physical Exam  Constitutional: She is oriented to person, place, and time. She appears well-developed and well-nourished.  HENT:  Head: Normocephalic and atraumatic.  Eyes: Conjunctivae and EOM are normal. Pupils are equal, round, and reactive to light.  Neck: Normal range of motion. Neck supple.  Cardiovascular: Normal rate and regular rhythm.   Pulmonary/Chest: Effort normal and breath sounds normal.  Abdominal: Soft. Bowel sounds are normal.  Musculoskeletal: Normal range of motion.  Neurological: She is alert and oriented to person, place, and time.  Grossly normal  Skin: Skin is warm and dry.  Psychiatric: She has a normal mood and affect. Her behavior is normal.  Nursing note and vitals reviewed.   ED Course  Procedures (including critical care time) Labs Review Labs Reviewed  BASIC METABOLIC PANEL - Abnormal; Notable for the following:    Glucose, Bld 117 (*)    All other components within normal limits  CBC - Abnormal; Notable for the following:    Hemoglobin 11.8 (*)    HCT 35.7 (*)    All other components within normal limits  URINALYSIS, ROUTINE W REFLEX MICROSCOPIC (NOT AT [facility]) - Abnormal; Notable for the following:    Hgb urine dipstick SMALL (*)    Leukocytes, UA MODERATE (*)    All other components within normal limits  URINE MICROSCOPIC-ADD ON - Abnormal; Notable for the following:     Squamous Epithelial / LPF 0-5 (*)    Bacteria, UA RARE (*)    All other components within normal limits  URINE CULTURE    Imaging Review No results found. I have personally reviewed and evaluated these images and lab results as part of my medical decision-making.   EKG Interpretation   Date/Time:  Saturday April 04 2015 11:55:38 EDT Ventricular Rate:  76 PR Interval:  150 QRS Duration: 68 QT Interval:  364 QTC Calculation: 409 R Axis:   26 Text Interpretation:  Normal sinus  rhythm Normal ECG Confirmed by Jeneen Rinks   MD, [place] ([id number]) on 04/04/2015 1:07:25 PM      MDM   Final diagnoses:  Vertigo  UTI (lower urinary tract infection)    History and physical consistent with vertigo. Rx Antivert 25 mg. There is also evidence of a minor UTI. Rx Keflex 500 mg 3 times a day. Urine culture. She has primary care follow-up.    Nat Christen, MD 04/04/15 [phone]

## 2015-04-04 NOTE — ED Notes (Signed)
Dr. Cook at bedside to evaluate patient.  

## 2015-04-04 NOTE — ED Notes (Signed)
Patient c/o dizziness with nausea. Per patient dizziness started last night. Denies any neurological deficits or headache. Per family patient is currently being treated for UTI. Patient taking Cipro and Augmentin. Patient called by PCP and told that Cipro would not help and new antibiotic would be called in but she has not got it yet.

## 2015-04-04 NOTE — Discharge Instructions (Signed)
You have some evidence of a minor urinary infection. Antibiotic for same. Also prescription for vertigo or "inner ear"

## 2015-04-06 LAB — URINE CULTURE
Culture: NO GROWTH
SPECIAL REQUESTS: NORMAL

## 2015-06-01 ENCOUNTER — Ambulatory Visit (INDEPENDENT_AMBULATORY_CARE_PROVIDER_SITE_OTHER): Payer: Medicare Other | Admitting: Gastroenterology

## 2015-06-01 ENCOUNTER — Encounter: Payer: Self-pay | Admitting: Gastroenterology

## 2015-06-01 VITALS — BP 114/68 | HR 80 | Ht 61.75 in | Wt 146.2 lb

## 2015-06-01 DIAGNOSIS — K645 Perianal venous thrombosis: Secondary | ICD-10-CM | POA: Diagnosis not present

## 2015-06-01 DIAGNOSIS — K6289 Other specified diseases of anus and rectum: Secondary | ICD-10-CM

## 2015-06-01 MED ORDER — AMBULATORY NON FORMULARY MEDICATION: Status: DC

## 2015-06-01 MED ORDER — HYDROCORTISONE 2.5 % RE CREA
1.0000 | TOPICAL_CREAM | Freq: Every day | RECTAL | Status: DC
Start: 2015-06-01 — End: 2015-12-29

## 2015-06-01 NOTE — Patient Instructions (Signed)
Use Sitz bath three times a day for 7-10 days, You can purchase a Sitz bath over the counter at your pharmacy We have given you a printed prescription of Nitroglycerin Gel to take to [facility] has been sent to your regular pharmacy  Follow up as needed

## 2015-06-09 NOTE — Progress Notes (Signed)
Aimee Ward    [id number]    Oct 28, 1933  Primary Care Physician:LONG,ASHLEY B, PA-C  Referring Physician: Blair Heys, PA-C [address] [place], [place] [address]  Chief complaint:  ?growth in the rectum  HPI: 82 yr F previously followed by Dr. Olevia Ward is here for evaluation of rectal pain and possible growth in the rectum that she noticed it first 1 day ago, she has pain when she is sitting down and also with bowel movement, and she felt a nodule or growth in the rectum and is concerned about it. Last colonoscopy in February 2016 showed melanosis coli, sigmoid diverticulosis, internal hemorrhoids and a diminutive sessile polyp (tubular adenoma) was removed. Denies any nausea, vomiting, abdominal pain, melena or bright red blood per rectum. No fever or chills   Outpatient Encounter Prescriptions as of 06/01/2015  Medication Sig  . ALPRAZolam (XANAX) 0.5 MG tablet Take 0.25 mg by mouth at bedtime as needed for anxiety or sleep.   Marland Kitchen amLODipine (NORVASC) 2.5 MG tablet Take 1 tablet (2.5 mg total) by mouth daily.  Marland Kitchen dicyclomine (BENTYL) 20 MG tablet Take one every 6 hours for abd cramps (Patient taking differently: Take 20 mg by mouth. Take one every 6 hours for abd cramps)  . ESTRACE VAGINAL 0.1 MG/GM vaginal cream Apply 1 application topically every other day.   Marland Kitchen HYDROcodone-acetaminophen (NORCO/VICODIN) 5-325 MG tablet Take 1 tablet by mouth every 6 (six) hours as needed.  Marland Kitchen levothyroxine (SYNTHROID, LEVOTHROID) 88 MCG tablet Take 88 mcg by mouth daily before breakfast.  . loratadine (CLARITIN) 10 MG tablet Take 10 mg by mouth daily.   . meclizine (ANTIVERT) 25 MG tablet Take 1 tablet (25 mg total) by mouth 3 (three) times daily as needed for dizziness.  . methocarbamol (ROBAXIN) 500 MG tablet Take 1 tablet (500 mg total) by mouth every 6 (six) hours as needed for muscle spasms. (Patient taking differently: Take 500 mg by mouth 2 (two) times daily. )  . metoprolol  succinate (TOPROL-XL) 25 MG 24 hr tablet Take 1 tablet by mouth 2 (two) times daily.  . Multiple Vitamins-Minerals (PRESERVISION AREDS) CAPS Take 1 capsule by mouth daily. Take 1 tab twice a day  . omeprazole (PRILOSEC) 20 MG capsule Take 20 mg by mouth daily.   . ondansetron (ZOFRAN ODT) 4 MG disintegrating tablet 4mg  ODT q4 hours prn nausea/vomit  . pravastatin (PRAVACHOL) 40 MG tablet Take 80 mg by mouth daily.   . AMBULATORY NON FORMULARY MEDICATION Medication Name: 0.125% Nitroglycerin gel every eight hours for 2 weeks  . hydrocortisone (ANUSOL-HC) 2.5 % rectal cream Place 1 application rectally at bedtime.  . [DISCONTINUED] ciprofloxacin (CIPRO) 500 MG tablet Take 500 mg by mouth 2 (two) times daily.  . [DISCONTINUED] escitalopram (LEXAPRO) 10 MG tablet Take 10 mg by mouth daily.   No facility-administered encounter medications on file as of 06/01/2015.    Allergies as of 06/01/2015 - Review Complete 06/01/2015  Allergen Reaction Noted  . Hctz [hydrochlorothiazide] Other (See Comments) 02/22/2011  . Lisinopril Itching 02/22/2011    Past Medical History  Diagnosis Date  . Anxiety   . Mixed hyperlipidemia   . Osteoarthritis   . Migraine   . PNA (pneumonia) 2006  . Murmur   . UTI (lower urinary tract infection)   . Syncope   . Aortic stenosis, moderate   . HTN (hypertension), benign     sees Dr Peter Martinique annually to check  heart valve  . PONV (postoperative nausea and vomiting)   . GERD (gastroesophageal reflux disease)   . DDD (degenerative disc disease), lumbar   . Macular degeneration, bilateral   . Hearing loss of both ears     wears hearing aides  . Full dentures     upper  . Hepatitis 1964  . Hypothyroidism   . CHF (congestive heart failure) ([place]) 2006    Past Surgical History  Procedure Laterality Date  . Foot surgery    . Thyroid surgery    . Cataract extraction    . Eye surgery      bilateral detatched retina  . Back surgery  2004  . Colonoscopy w/  polypectomy  2016  . Lumbar fusion  2016    Family History  Problem Relation Age of Onset  . Diabetes    . Hypertension    . Stroke    . Arthritis    . Migraines    . Hypertension Mother   . Stroke Mother   . Diabetes Father   . Hypertension Brother   . Rheumatic fever Brother     Social History   Social History  . Marital Status: Widowed    Spouse Name: N/A  . Number of Children: 4  . Years of Education: N/A   Occupational History  . Not on file.   Social History Main Topics  . Smoking status: Never Smoker   . Smokeless tobacco: Never Used  . Alcohol Use: No  . Drug Use: No  . Sexual Activity: No   Other Topics Concern  . Not on file   Social History Narrative      Review of systems: Review of Systems  Constitutional: Negative for fever and chills.  HENT: Negative.   Eyes: Negative for blurred vision.  Respiratory: Negative for cough, shortness of breath and wheezing.   Cardiovascular: Negative for chest pain and palpitations.  Gastrointestinal: as per HPI Genitourinary: Negative for dysuria, urgency, frequency and hematuria.  Musculoskeletal: Negative for myalgias, back pain and joint pain.  Skin: Negative for itching and rash.  Neurological: Negative for dizziness, tremors, focal weakness, seizures and loss of consciousness.  Endo/Heme/Allergies: Negative for environmental allergies.  Psychiatric/Behavioral: Negative for depression, suicidal ideas and hallucinations.  All other systems reviewed and are negative.   Physical Exam: Filed Vitals:   06/01/15 1436  BP: 114/68  Pulse: 80   Gen:      No acute distress HEENT:  EOMI, sclera anicteric Neck:     No masses; no thyromegaly Lungs:    Clear to auscultation bilaterally; normal respiratory effort CV:         Regular rate and rhythm; no murmurs Abd:      + bowel sounds; soft, non-tender; no palpable masses, no distension Ext:    No edema; adequate peripheral perfusion Skin:      Warm and dry;  no rash Neuro: alert and oriented x 3 Psych: normal mood and affect Rectal exam: Normal anal sphincter tone, no anal fissure , thrombosed  hemorrhoids Anoscopy not performed  Data Reviewed:  Reviewed chart in epic   Assessment and Plan/Recommendations: 82 year old female here with complaints of rectal pain and a palpable nodule/growth which on exam appears to be thrombosed hemorrhoid. Reassured patient and informed her that it does resolve on its own with the solution of clot in the next few days Recommended  twice daily Sitz bath and 0.125% nitroglycerine rectal cream 3 times a day for next 2 weeks  Can apply Anusol as needed If continues to have worsening pain will refer to surgery for definitive treatment of the hemorrhoid  K. Denzil Magnuson , MD [phone] Mon-Fri 8a-5p [phone] after 5p, weekends, holidays  CC: Blair Heys, PA-C

## 2015-06-29 ENCOUNTER — Other Ambulatory Visit: Payer: Self-pay

## 2015-10-26 ENCOUNTER — Other Ambulatory Visit: Payer: Self-pay

## 2015-10-26 ENCOUNTER — Ambulatory Visit (HOSPITAL_COMMUNITY): Payer: Medicare Other | Attending: Cardiology

## 2015-10-26 DIAGNOSIS — I352 Nonrheumatic aortic (valve) stenosis with insufficiency: Secondary | ICD-10-CM | POA: Diagnosis not present

## 2015-10-26 DIAGNOSIS — I350 Nonrheumatic aortic (valve) stenosis: Secondary | ICD-10-CM | POA: Diagnosis present

## 2015-10-26 DIAGNOSIS — I110 Hypertensive heart disease with heart failure: Secondary | ICD-10-CM | POA: Diagnosis not present

## 2015-10-26 DIAGNOSIS — I340 Nonrheumatic mitral (valve) insufficiency: Secondary | ICD-10-CM | POA: Insufficient documentation

## 2015-10-26 DIAGNOSIS — I509 Heart failure, unspecified: Secondary | ICD-10-CM | POA: Diagnosis not present

## 2015-10-26 LAB — ECHOCARDIOGRAM COMPLETE
AO mean calculated velocity dopler: 176 cm/s
AV Area VTI index: 0.88 cm2/m2
AV Area VTI: 1.51 cm2
AV Area mean vel: 1.34 cm2
AV Mean grad: 14 mmHg
AV Peak grad: 23 mmHg
AV VEL mean LVOT/AV: 0.32
AV area mean vel ind: 0.78 cm2/m2
AV peak Index: 0.88
AV pk vel: 241 cm/s
AV vel: 1.51
Ao pk vel: 0.36 m/s
E decel time: 275 ms
E/e' ratio: 14.33
FS: 46 % — AB (ref 28–44)
IVS/LV PW RATIO, ED: 0.96
LA ID, A-P, ES: 35 mm
LA diam end sys: 35 mm
LA diam index: 2.04 cm/m2
LA vol A4C: 41 mL
LA vol index: 22.7 mL/m2
LA vol: 39 mL
LV E/e' medial: 14.33
LV E/e'average: 14.33
LV PW d: 13.5 mm — AB (ref 0.6–1.1)
LV e' LATERAL: 3.51 cm/s
LVOT SV: 76 mL
LVOT VTI: 18.2 cm
LVOT area: 4.15 cm2
LVOT diameter: 23 mm
LVOT peak VTI: 0.36 cm
LVOT peak vel: 87.6 cm/s
Lateral S' vel: 13.3 cm/s
MV Dec: 275
MV pk A vel: 78 m/s
MV pk E vel: 50.3 m/s
P 1/2 time: 443 ms
Peak grad: 222 mmHg
RV sys press: 23 mmHg
Reg peak vel: 222 cm/s
TDI e' lateral: 3.51
TDI e' medial: 2.19
TR max vel: 222 cm/s
VTI: 50.1 cm
Valve area index: 0.88
Valve area: 1.51 cm2

## 2015-12-28 NOTE — Progress Notes (Signed)
Doree Fudge Date of Birth: March 12, 1933 Medical Record [id number]  History of Present Illness: Ms. Hanania is seen for followup. She has HTN and HLD. She has a history of moderate aortic stenosis. She has a prior history of near syncope that resolved with reduction in BP meds. Event monitor showed few PACs and PVCs.  On follow up today she is feeling very well. No palpitations. She underwent extensive back surgery in August 2016. She had bladder surgery this past summer. She has recovered well from this. She is living with her daughter and is fairly inactive. She no longer drives due to macular degeneration. No SOB or Chest pain. No dizziness.   Current Outpatient Prescriptions  Medication Sig Dispense Refill  . ALPRAZolam (XANAX) 0.5 MG tablet Take 0.25 mg by mouth at bedtime as needed for anxiety or sleep.     Marland Kitchen amLODipine (NORVASC) 2.5 MG tablet Take 1 tablet (2.5 mg total) by mouth daily. 180 tablet 3  . dicyclomine (BENTYL) 20 MG tablet Take one every 6 hours for abd cramps (Patient taking differently: Take 20 mg by mouth. Take one every 6 hours for abd cramps) 20 tablet 0  . ESTRACE VAGINAL 0.1 MG/GM vaginal cream Apply 1 application topically every other day.   3  . HYDROcodone-acetaminophen (NORCO/VICODIN) 5-325 MG tablet Take 1 tablet by mouth every 6 (six) hours as needed.  0  . levothyroxine (SYNTHROID, LEVOTHROID) 88 MCG tablet Take 88 mcg by mouth daily before breakfast.    . loratadine (CLARITIN) 10 MG tablet Take 10 mg by mouth daily.     . meclizine (ANTIVERT) 25 MG tablet Take 1 tablet (25 mg total) by mouth 3 (three) times daily as needed for dizziness. 15 tablet 0  . methocarbamol (ROBAXIN) 500 MG tablet Take 1 tablet (500 mg total) by mouth every 6 (six) hours as needed for muscle spasms. (Patient taking differently: Take 500 mg by mouth 2 (two) times daily. ) 60 tablet 3  . metoprolol succinate (TOPROL-XL) 25 MG 24 hr tablet Take 1 tablet by mouth 2 (two) times daily.     . Multiple Vitamins-Minerals (PRESERVISION AREDS) CAPS Take 1 capsule by mouth daily. Take 1 tab twice a day    . omeprazole (PRILOSEC) 20 MG capsule Take 20 mg by mouth daily.     . pravastatin (PRAVACHOL) 40 MG tablet Take 80 mg by mouth daily.      No current facility-administered medications for this visit.     Allergies  Allergen Reactions  . Nitrofurantoin Itching    Other  . Hctz [Hydrochlorothiazide] Other (See Comments)    Cramps   . Lisinopril Itching    Past Medical History:  Diagnosis Date  . Anxiety   . Aortic stenosis, moderate   . CHF (congestive heart failure) ([place]) 2006  . DDD (degenerative disc disease), lumbar   . Full dentures    upper  . GERD (gastroesophageal reflux disease)   . Hearing loss of both ears    wears hearing aides  . Hepatitis 1964  . HTN (hypertension), benign    sees Dr Peter Martinique annually to check heart valve  . Hypothyroidism   . Macular degeneration, bilateral   . Migraine   . Mixed hyperlipidemia   . Murmur   . Osteoarthritis   . PNA (pneumonia) 2006  . PONV (postoperative nausea and vomiting)   . Syncope   . UTI (lower urinary tract infection)     Past Surgical History:  Procedure Laterality  Date  . BACK SURGERY  2004  . CATARACT EXTRACTION    . COLONOSCOPY W/ POLYPECTOMY  2016  . EYE SURGERY     bilateral detatched retina  . FOOT SURGERY    . LUMBAR FUSION  2016  . THYROID SURGERY      History  Smoking Status  . Never Smoker  Smokeless Tobacco  . Never Used    History  Alcohol Use No    Family History  Problem Relation Age of Onset  . Diabetes    . Hypertension    . Stroke    . Arthritis    . Migraines    . Hypertension Mother   . Stroke Mother   . Diabetes Father   . Hypertension Brother   . Rheumatic fever Brother     Review of Systems: The review of systems is per the HPI.  All other systems were reviewed and are negative.  Physical Exam: BP 130/80 (BP Location: Right Arm, Patient  Position: Sitting, Cuff Size: Normal)   Pulse 91   Ht 5\' 1"  (1.549 m)   Wt 152 lb 3.2 oz (69 kg)   SpO2 96%   BMI 28.76 kg/m  Patient is very pleasant and in no acute distress. Skin is warm and dry. Color is normal.  HEENT is unremarkable. Normocephalic/atraumatic. PERRL. Sclera are nonicteric. Neck is supple. No masses. No JVD. Lungs are clear. Cardiac exam shows a regular rate and rhythm. There is a harsh Grade 2/6 outflow murmur noted of AS. This radiates to the carotids with normal carotid upstroke. Abdomen is soft. Extremities are without edema. Gait and ROM are intact. No gross neurologic deficits noted.  LABORATORY DATA: Lab Results  Component Value Date   WBC 5.2 04/04/2015   HGB 11.8 (L) 04/04/2015   HCT 35.7 (L) 04/04/2015   PLT 203 04/04/2015   GLUCOSE 117 (H) 04/04/2015   ALT 31 02/16/2015   AST 29 02/16/2015   NA 136 04/04/2015   K 3.9 04/04/2015   CL 106 04/04/2015   CREATININE 0.73 04/04/2015   BUN 11 04/04/2015   CO2 22 04/04/2015    Labs dated 05/27/15: cholesterol 146, triglycerides 127, HDL 46, LDL 75. CMET and CBC normal.  Echo Study :10/26/15 Study Conclusions  - Left ventricle: The cavity size was normal. Wall thickness was   increased in a pattern of moderate LVH. Systolic function was   normal. The estimated ejection fraction was in the range of 55%   to 60%. Wall motion was normal; there were no regional wall   motion abnormalities. Doppler parameters are consistent with   abnormal left ventricular relaxation (grade 1 diastolic   dysfunction). Doppler parameters are consistent with high   ventricular filling pressure. - Aortic valve: Cusp separation was reduced. There was mild   stenosis. There was mild regurgitation. Valve area (VTI): 1.51   cm^2. Valve area (Vmax): 1.51 cm^2. Valve area (Vmean): 1.34   cm^2. - Mitral valve: Calcified annulus. Mildly thickened leaflets .   There was mild regurgitation.  Impressions:  - Normal LV systolic  function; moderate LVH; grade 1 diastolic   dysfunction; elevated LV filling pressure; calcified aortic valve   with mild AS by mean gradient (16 mmHg) but visually appears   worse; mild AI; mild MR and TR.  Assessment / Plan: 1. Presyncope/syncope - resolved. Improved with reduction in BP meds. Continue current therapy.  2. Moderate aortic stenosis. This has not changed significantly since February 2013. Follow up Echo  every 2 years.  3. Hypertension-well-controlled.  I will see in one year

## 2015-12-29 ENCOUNTER — Ambulatory Visit (INDEPENDENT_AMBULATORY_CARE_PROVIDER_SITE_OTHER): Payer: Medicare Other | Admitting: Cardiology

## 2015-12-29 VITALS — BP 130/80 | HR 91 | Ht 61.0 in | Wt 152.2 lb

## 2015-12-29 DIAGNOSIS — I350 Nonrheumatic aortic (valve) stenosis: Secondary | ICD-10-CM | POA: Diagnosis not present

## 2015-12-29 DIAGNOSIS — I10 Essential (primary) hypertension: Secondary | ICD-10-CM | POA: Diagnosis not present

## 2015-12-29 NOTE — Patient Instructions (Signed)
Continue your current therapy  I will see you in one year   

## 2017-01-14 NOTE — Progress Notes (Signed)
Aimee Ward Date of Birth: 08-29-1933 Medical Record #[id number]  History of Present Illness: Aimee Ward is seen for followup. She has HTN and HLD. She has a history of mild- moderate aortic stenosis. She has a prior history of near syncope that resolved with reduction in BP meds. Event monitor showed few PACs and PVCs.  On follow up today she is feeling very well. Very rare palpitations. Notes some recurrent UTIs.   She is living with her daughter and is fairly inactive. Notes she gets a little winded going up an incline. Otherwise no SOB or Chest pain. No dizziness. Notes her younger brother had an MI in June.   Current Outpatient Medications  Medication Sig Dispense Refill  . ALPRAZolam (XANAX) 0.5 MG tablet Take 0.25 mg by mouth at bedtime as needed for anxiety or sleep.     Marland Kitchen amLODipine (NORVASC) 2.5 MG tablet Take 1 tablet (2.5 mg total) by mouth daily. 180 tablet 3  . dicyclomine (BENTYL) 20 MG tablet Take one every 6 hours for abd cramps (Patient taking differently: Take 20 mg by mouth. Take one every 6 hours for abd cramps) 20 tablet 0  . ESTRACE VAGINAL 0.1 MG/GM vaginal cream Apply 1 application topically every other day.   3  . HYDROcodone-acetaminophen (NORCO/VICODIN) 5-325 MG tablet Take 1 tablet by mouth every 6 (six) hours as needed.  0  . levothyroxine (SYNTHROID, LEVOTHROID) 88 MCG tablet Take 88 mcg by mouth daily before breakfast.    . loratadine (CLARITIN) 10 MG tablet Take 10 mg by mouth daily.     . meclizine (ANTIVERT) 25 MG tablet Take 1 tablet (25 mg total) by mouth 3 (three) times daily as needed for dizziness. 15 tablet 0  . methocarbamol (ROBAXIN) 500 MG tablet Take 1 tablet (500 mg total) by mouth every 6 (six) hours as needed for muscle spasms. (Patient taking differently: Take 500 mg by mouth 2 (two) times daily. ) 60 tablet 3  . metoprolol succinate (TOPROL-XL) 25 MG 24 hr tablet Take 1 tablet by mouth 2 (two) times daily.    . Multiple Vitamins-Minerals  (PRESERVISION AREDS) CAPS Take 1 capsule by mouth daily. Take 1 tab twice a day    . omeprazole (PRILOSEC) 20 MG capsule Take 20 mg by mouth daily.     . pravastatin (PRAVACHOL) 40 MG tablet Take 80 mg by mouth daily.      No current facility-administered medications for this visit.     Allergies  Allergen Reactions  . Nitrofurantoin Itching    Other  . Hctz [Hydrochlorothiazide] Other (See Comments)    Cramps   . Lisinopril Itching    Past Medical History:  Diagnosis Date  . Anxiety   . Aortic stenosis, moderate   . CHF (congestive heart failure) ([place]) 2006  . DDD (degenerative disc disease), lumbar   . Full dentures    upper  . GERD (gastroesophageal reflux disease)   . Hearing loss of both ears    wears hearing aides  . Hepatitis 1964  . HTN (hypertension), benign    sees Dr Danae Oland Martinique annually to check heart valve  . Hypothyroidism   . Macular degeneration, bilateral   . Migraine   . Mixed hyperlipidemia   . Murmur   . Osteoarthritis   . PNA (pneumonia) 2006  . PONV (postoperative nausea and vomiting)   . Syncope   . UTI (lower urinary tract infection)     Past Surgical History:  Procedure Laterality Date  .  BACK SURGERY  2004  . CATARACT EXTRACTION    . COLONOSCOPY W/ POLYPECTOMY  2016  . EYE SURGERY     bilateral detatched retina  . FOOT SURGERY    . LUMBAR FUSION  2016  . THYROID SURGERY      Social History   Tobacco Use  Smoking Status Never Smoker  Smokeless Tobacco Never Used    Social History   Substance and Sexual Activity  Alcohol Use No    Family History  Problem Relation Age of Onset  . Hypertension Mother   . Stroke Mother   . Diabetes Father   . Hypertension Brother   . Rheumatic fever Brother   . Diabetes Unknown   . Hypertension Unknown   . Stroke Unknown   . Arthritis Unknown   . Migraines Unknown     Review of Systems: The review of systems is per the HPI.  All other systems were reviewed and are  negative.  Physical Exam: BP 130/76   Pulse 68   Ht 5\' 3"  (1.6 m)   Wt 160 lb 12.8 oz (72.9 kg)   BMI 28.48 kg/m  GENERAL:  Well appearing elderly female in NAD HEENT:  PERRL, EOMI, sclera are clear. Oropharynx is clear. NECK:  No jugular venous distention, carotid upstroke brisk and symmetric, no bruits, no thyromegaly or adenopathy LUNGS:  Clear to auscultation bilaterally CHEST:  Unremarkable HEART:  RRR,  PMI not displaced or sustained,S1 and S2 within normal limits, no S3, no S4: no clicks, no rubs, gr 2/6 systolic murmur RUSB.  ABD:  Soft, nontender. BS +, no masses or bruits. No hepatomegaly, no splenomegaly EXT:  2 + pulses throughout, no edema, no cyanosis no clubbing SKIN:  Warm and dry.  No rashes NEURO:  Alert and oriented x 3. Cranial nerves II through XII intact. PSYCH:  Cognitively intact    LABORATORY DATA:  Ecg today shows NSR with normal Ecg. I have personally reviewed and interpreted this study.  Lab Results  Component Value Date   WBC 5.2 04/04/2015   HGB 11.8 (L) 04/04/2015   HCT 35.7 (L) 04/04/2015   PLT 203 04/04/2015   GLUCOSE 117 (H) 04/04/2015   ALT 31 02/16/2015   AST 29 02/16/2015   NA 136 04/04/2015   K 3.9 04/04/2015   CL 106 04/04/2015   CREATININE 0.73 04/04/2015   BUN 11 04/04/2015   CO2 22 04/04/2015    Labs dated 05/27/15: cholesterol 146, triglycerides 127, HDL 46, LDL 75. CMET and CBC normal. Dated 06/08/16: cholesterol 145, triglycerides 118, HDL 45, LDL 76. CBC normal. CMET in October 2018 normal.  Echo Study :10/26/15 Study Conclusions  - Left ventricle: The cavity size was normal. Wall thickness was   increased in a pattern of moderate LVH. Systolic function was   normal. The estimated ejection fraction was in the range of 55%   to 60%. Wall motion was normal; there were no regional wall   motion abnormalities. Doppler parameters are consistent with   abnormal left ventricular relaxation (grade 1 diastolic   dysfunction).  Doppler parameters are consistent with high   ventricular filling pressure. - Aortic valve: Cusp separation was reduced. There was mild   stenosis. There was mild regurgitation. Valve area (VTI): 1.51   cm^2. Valve area (Vmax): 1.51 cm^2. Valve area (Vmean): 1.34   cm^2. - Mitral valve: Calcified annulus. Mildly thickened leaflets .   There was mild regurgitation.  Impressions:  - Normal LV systolic function; moderate  LVH; grade 1 diastolic   dysfunction; elevated LV filling pressure; calcified aortic valve   with mild AS by mean gradient (16 mmHg) but visually appears   worse; mild AI; mild MR and TR.  Assessment / Plan: 1. Mild to Moderate aortic stenosis. This has not changed significantly since February 2013. Follow up Echo every 2 years. Encourage increased aerobic activity.  3. Hypertension-well-controlled.  I will see in one year

## 2017-01-16 ENCOUNTER — Ambulatory Visit (INDEPENDENT_AMBULATORY_CARE_PROVIDER_SITE_OTHER): Payer: Medicare Other | Admitting: Cardiology

## 2017-01-16 ENCOUNTER — Encounter: Payer: Self-pay | Admitting: Cardiology

## 2017-01-16 VITALS — BP 130/76 | HR 68 | Ht 63.0 in | Wt 160.8 lb

## 2017-01-16 DIAGNOSIS — E7800 Pure hypercholesterolemia, unspecified: Secondary | ICD-10-CM

## 2017-01-16 DIAGNOSIS — I350 Nonrheumatic aortic (valve) stenosis: Secondary | ICD-10-CM

## 2017-01-16 DIAGNOSIS — I10 Essential (primary) hypertension: Secondary | ICD-10-CM | POA: Diagnosis not present

## 2017-01-16 NOTE — Addendum Note (Signed)
Addended by: Kathyrn Lass on: 01/16/2017 10:04 AM   Modules accepted: Orders

## 2017-01-16 NOTE — Patient Instructions (Signed)
Continue your current therapy  Increase your aerobic activity  I will see you in one year.   

## 2017-01-25 IMAGING — CT CT ABD-PELV W/ CM
2 of 5 series · 16 of 46 positions shown, 18 images · IV contrast (Omnipaque 300)
Comparison: 12/19/2013

CLINICAL DATA: Left upper quadrant pain with nausea vomiting and
diarrhea for 2-3 days. History of hepatitis 50 years ago.
Hypertension. Gastroesophageal reflux disease.

EXAM:
CT ABDOMEN AND PELVIS WITH CONTRAST
TECHNIQUE: Multidetector CT imaging of the abdomen and pelvis was performed
using the standard protocol following bolus administration of
intravenous contrast.
CONTRAST:  100mL OMNIPAQUE IOHEXOL 300 MG/ML  SOLN

[Series 2: abd_pel_with 5.0 b40f · axial · 0.69mm/px · z∈[-392,-12]mm · 13 of 86 slices shown, 15 images]
[im 5/86  soft-tissue]
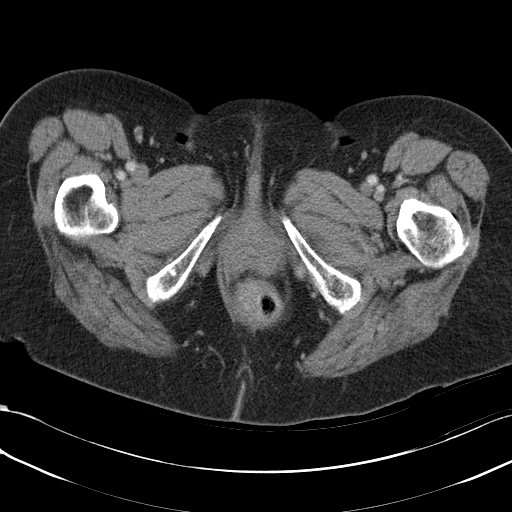
[im 5/86  bone]
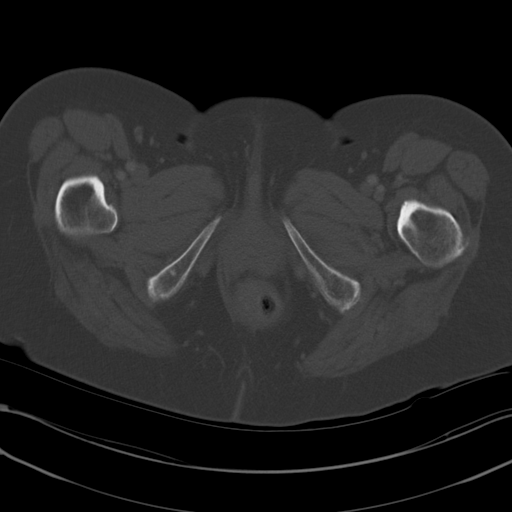
[im 10/86  soft-tissue]
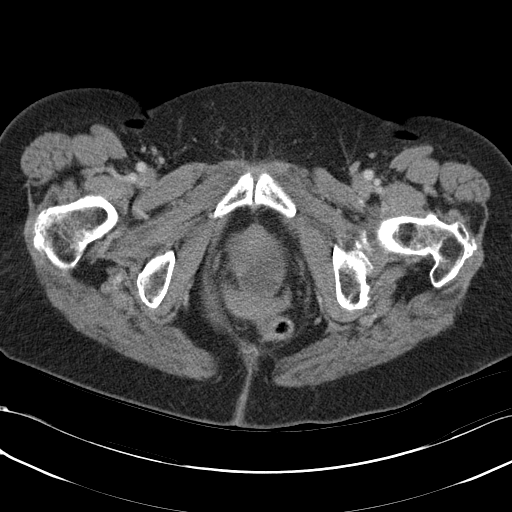
[im 19/86  soft-tissue]
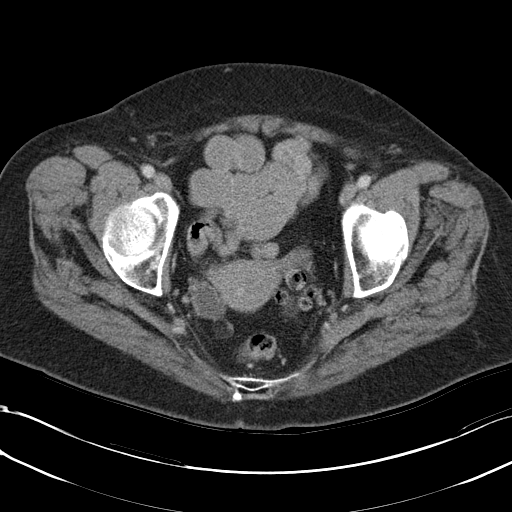
[im 24/86  soft-tissue]
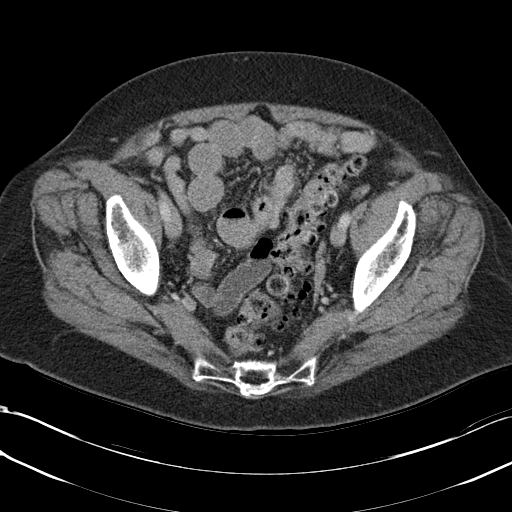
[im 29/86  soft-tissue]
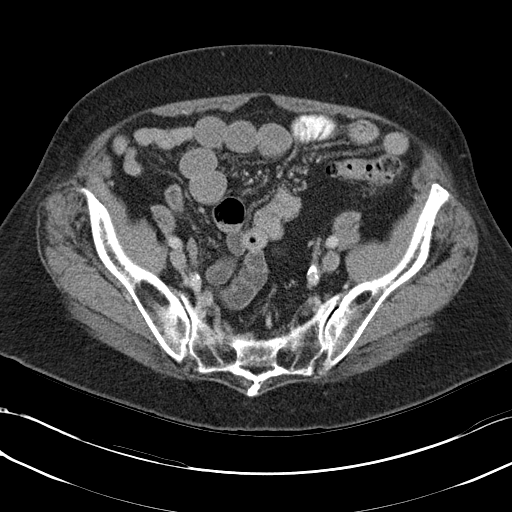
[im 38/86  soft-tissue]
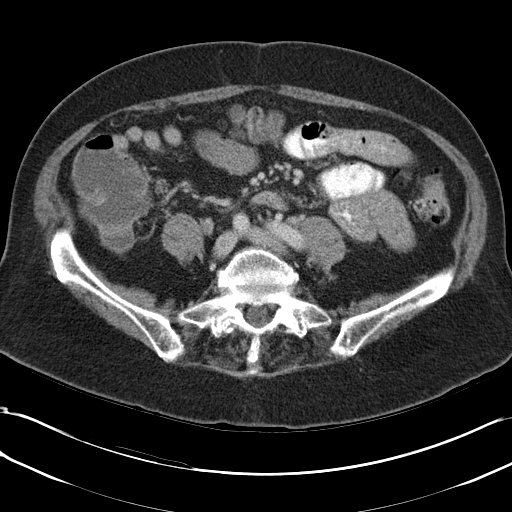
[im 43/86  soft-tissue]
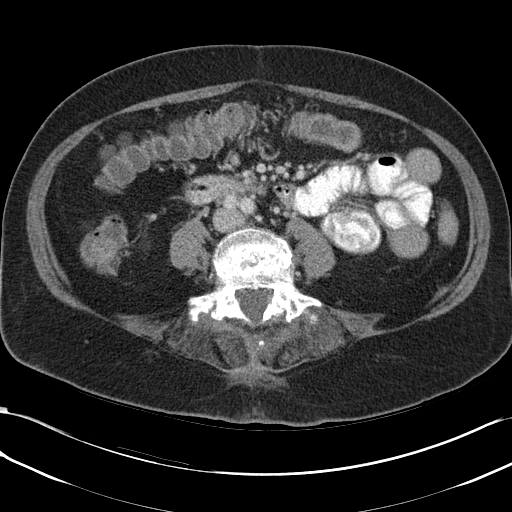
[im 48/86  soft-tissue]
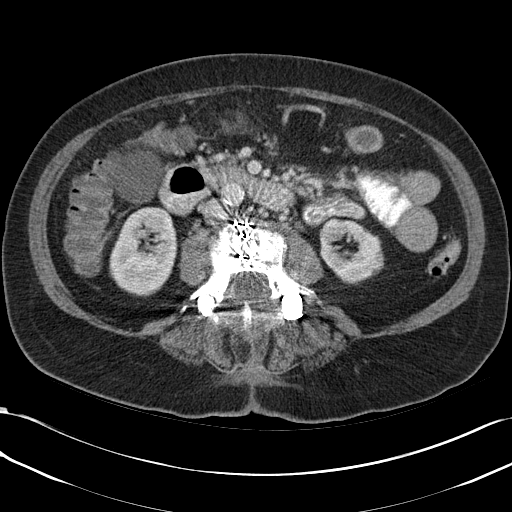
[im 57/86  soft-tissue]
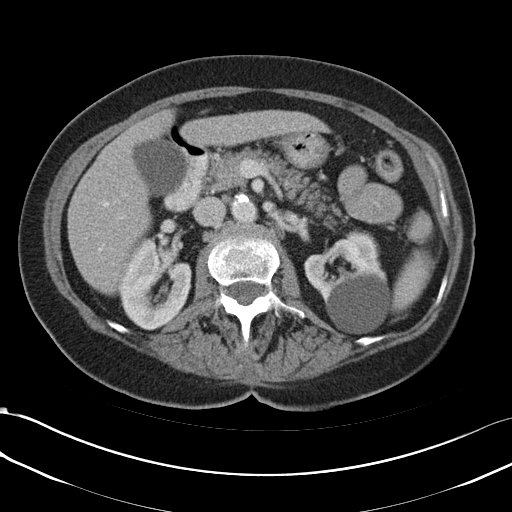
[im 57/86  bone]
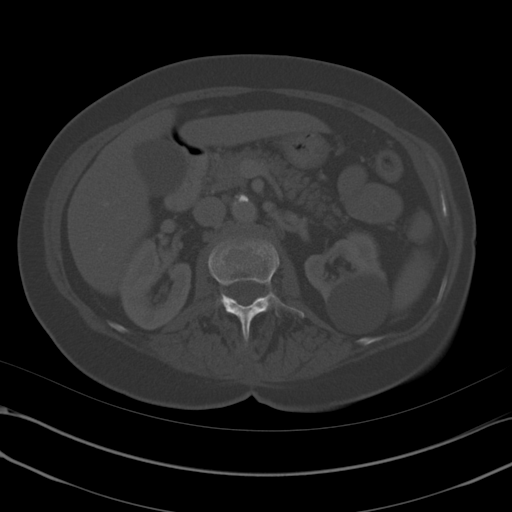
[im 62/86  soft-tissue]
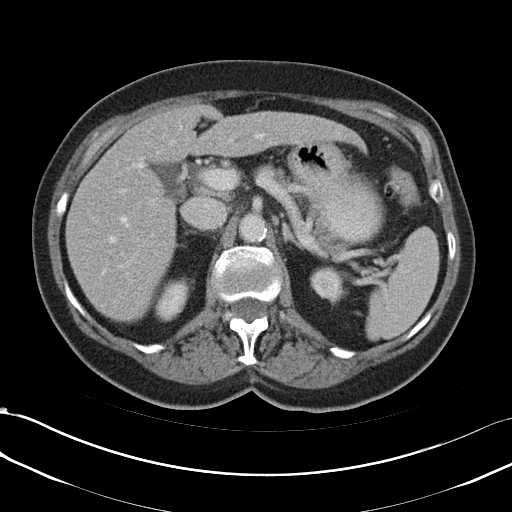
[im 67/86  soft-tissue]
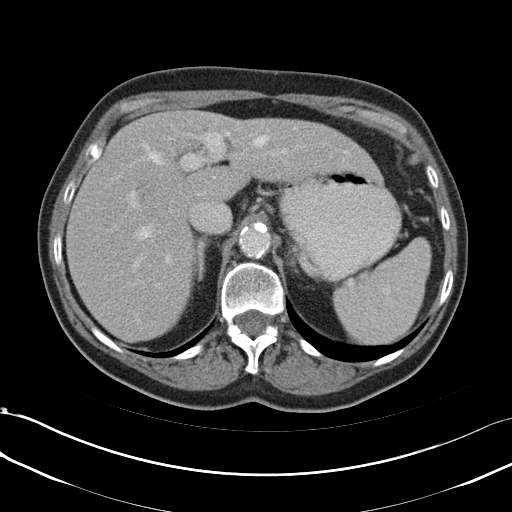
[im 76/86  soft-tissue]
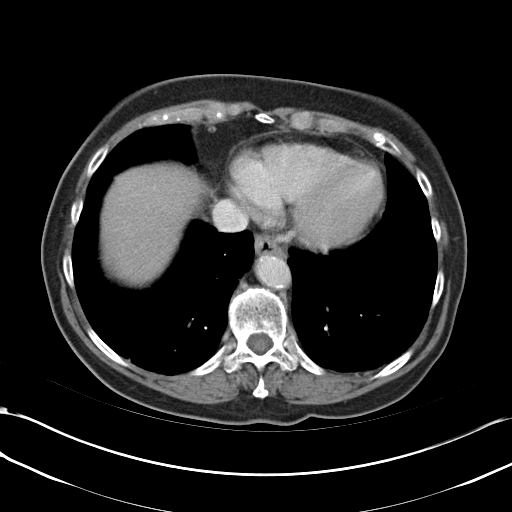
[im 81/86  soft-tissue]
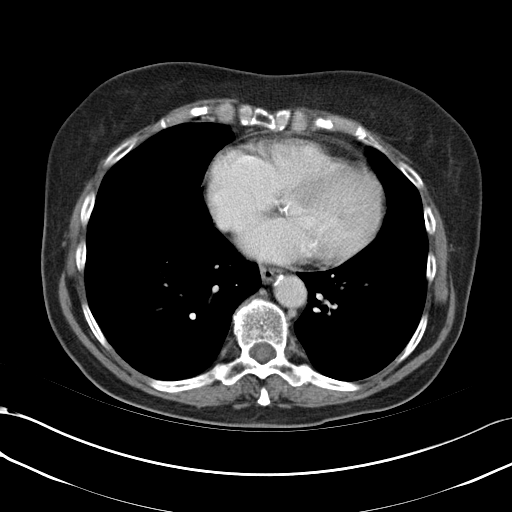

[Series 4: abd_pel_with 3.0 spo cor · coronal · 0.77mm/px · 3 of 86 slices shown]
[im 29/86  soft-tissue]
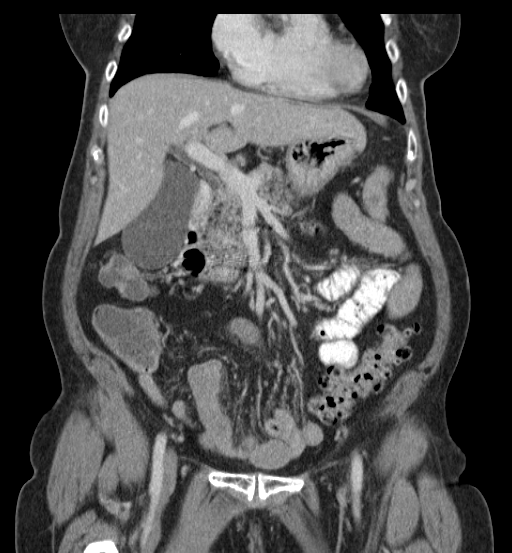
[im 38/86  soft-tissue]
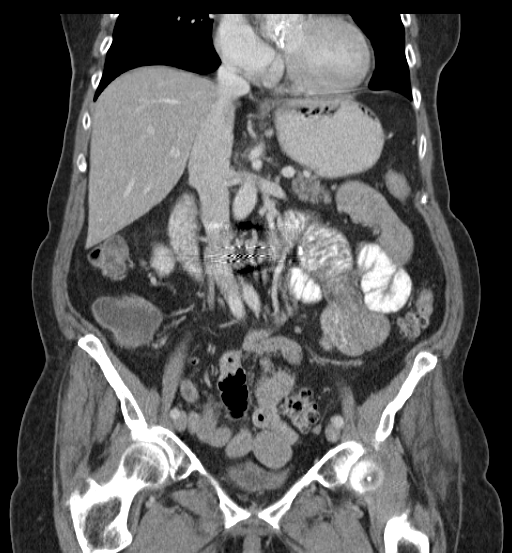
[im 48/86  soft-tissue]
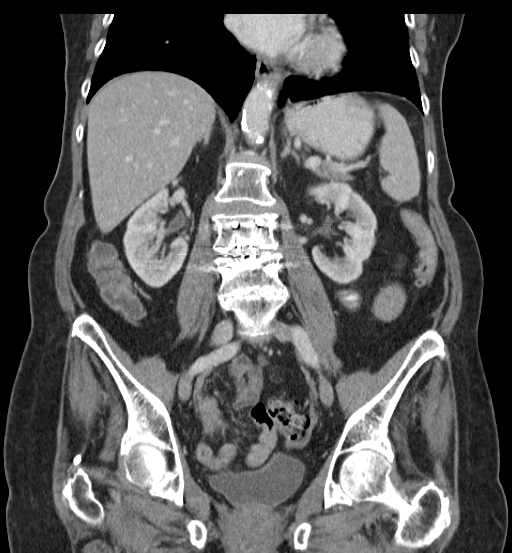

[16 of 46 positions shown; findings below may reference images not displayed]

FINDINGS: Lower chest: Minimal motion degradation at the lung bases. Bibasilar
dependent atelectasis. Bibasilar nodularity was present on the prior
exam and can be presumed to be benign given stability. Normal heart
size without pericardial or pleural effusion.

Hepatobiliary: Normal liver. Normal gallbladder, without biliary
ductal dilatation.

Pancreas: Normal, without mass or ductal dilatation.

Spleen: Normal in size, without focal abnormality.

Adrenals/Urinary Tract: Normal adrenal glands. A too small to
characterize lesion in the interpolar right kidney. An interpolar
left renal 4.4 cm fluid density lesion is again consistent with a
cyst. There is mild cortical scarring and irregularity involving the
left kidney. No hydronephrosis. Normal urinary bladder.

Stomach/Bowel: Normal stomach, without wall thickening. Extensive
colonic diverticulosis. Mild muscular hypertrophy involves the
sigmoid colon. Normal terminal ileum and appendix.

Normal small bowel.

Vascular/Lymphatic: Aortic and branch vessel atherosclerosis. No
abdominopelvic adenopathy.

Reproductive: Normal uterus and adnexa.

Other: No significant free fluid. Mild to moderate pelvic floor
laxity.

Musculoskeletal: Lumbar spine fixation. Convex right lumbar spine
curvature is mild.
IMPRESSION: 1.  No acute process or explanation for left upper quadrant pain.
2. Pelvic floor laxity.

## 2017-10-30 ENCOUNTER — Telehealth: Payer: Self-pay | Admitting: Cardiology

## 2017-10-30 NOTE — Telephone Encounter (Signed)
Spoke with daughter Altha Harm who reports patient has been having black out spells for about 1 week. She saw PCP last week and they didn't do anything. Her BP has been running low - 110/60 (normally 124/80). She experiences these symptoms when she changes positions. Advised that patient continue to track home BP/HR and bring list of readings to her OV tomorrow @ 2pm.

## 2017-10-31 ENCOUNTER — Ambulatory Visit (INDEPENDENT_AMBULATORY_CARE_PROVIDER_SITE_OTHER): Payer: Medicare Other | Admitting: Adult Health

## 2017-10-31 ENCOUNTER — Telehealth: Payer: Self-pay

## 2017-10-31 ENCOUNTER — Encounter: Payer: Self-pay | Admitting: Adult Health

## 2017-10-31 VITALS — BP 119/78 | HR 79 | Ht 63.0 in | Wt 151.0 lb

## 2017-10-31 DIAGNOSIS — R55 Syncope and collapse: Secondary | ICD-10-CM | POA: Diagnosis not present

## 2017-10-31 DIAGNOSIS — I951 Orthostatic hypotension: Secondary | ICD-10-CM | POA: Diagnosis not present

## 2017-10-31 DIAGNOSIS — I350 Nonrheumatic aortic (valve) stenosis: Secondary | ICD-10-CM

## 2017-10-31 DIAGNOSIS — R002 Palpitations: Secondary | ICD-10-CM | POA: Diagnosis not present

## 2017-10-31 NOTE — Telephone Encounter (Signed)
[id number] APPT [facility] WANTS PT TO HAVE ECHO DONE  ORDER ENTERED

## 2017-10-31 NOTE — Progress Notes (Signed)
Cardiology Office Note   Date:  10/31/2017   ID:  Aimee Ward, DOB 1933-10-30, MRN [id number]  PCP:  Blair Heys, PA-C  Cardiologist: Dr. Martinique  Chief Complaint  Patient presents with  . Hypertension  . Aortic Stenosis  . Near Syncope     History of Present Illness: Aimee Ward is a 84 y.o. female who presents for ongoing assessment and management of mild to moderate stenosis, hypertension, hypercholesterolemia, palpitations. Other history includes thyroid disease and arthritis. She has a history of syncope due to antihypertensive medications. She was to keep track of BP and HR at home and bring the recorded values with her to this appointment.   She states that when she is standing for any period of time she feels near syncope. She denies LOC. She states that she is feeling more palpitations, especially in the am.    On review of her medications it is found that she is on pain control with hydrocodone, and also prn "muscle relaxants." She takes this daily.    Past Medical History:  Diagnosis Date  . Anxiety   . Aortic stenosis, moderate   . CHF (congestive heart failure) ([place]) 2006  . DDD (degenerative disc disease), lumbar   . Full dentures    upper  . GERD (gastroesophageal reflux disease)   . Hearing loss of both ears    wears hearing aides  . Hepatitis 1964  . HTN (hypertension), benign    sees Dr Peter Martinique annually to check heart valve  . Hypothyroidism   . Macular degeneration, bilateral   . Migraine   . Mixed hyperlipidemia   . Murmur   . Osteoarthritis   . PNA (pneumonia) 2006  . PONV (postoperative nausea and vomiting)   . Syncope   . UTI (lower urinary tract infection)     Past Surgical History:  Procedure Laterality Date  . BACK SURGERY  2004  . CATARACT EXTRACTION    . COLONOSCOPY W/ POLYPECTOMY  2016  . EYE SURGERY     bilateral detatched retina  . FOOT SURGERY    . LUMBAR FUSION  2016  . THYROID SURGERY       Current  Outpatient Medications  Medication Sig Dispense Refill  . ALPRAZolam (XANAX) 0.5 MG tablet Take 0.25 mg by mouth at bedtime as needed for anxiety or sleep.     Marland Kitchen ESTRACE VAGINAL 0.1 MG/GM vaginal cream Apply 1 application topically every other day.   3  . HYDROcodone-acetaminophen (NORCO/VICODIN) 5-325 MG tablet Take 1 tablet by mouth every 6 (six) hours as needed.  0  . levothyroxine (SYNTHROID, LEVOTHROID) 88 MCG tablet Take 88 mcg by mouth daily before breakfast.    . loratadine (CLARITIN) 10 MG tablet Take 10 mg by mouth daily.     . meclizine (ANTIVERT) 25 MG tablet Take 1 tablet (25 mg total) by mouth 3 (three) times daily as needed for dizziness. 15 tablet 0  . methocarbamol (ROBAXIN) 500 MG tablet Take 1 tablet (500 mg total) by mouth every 6 (six) hours as needed for muscle spasms. (Patient taking differently: Take 500 mg by mouth 2 (two) times daily. ) 60 tablet 3  . metoprolol succinate (TOPROL-XL) 25 MG 24 hr tablet Take 1 tablet by mouth 2 (two) times daily.    . Multiple Vitamins-Minerals (PRESERVISION AREDS) CAPS Take 1 capsule by mouth daily. Take 1 tab twice a day    . omeprazole (PRILOSEC) 20 MG capsule Take 20 mg by mouth daily.     Marland Kitchen  pravastatin (PRAVACHOL) 40 MG tablet Take 80 mg by mouth daily.     Marland Kitchen trimethoprim (TRIMPEX) 100 MG tablet Take by mouth.     No current facility-administered medications for this visit.     Allergies:   Lisinopril; Sulfa antibiotics; Ciprofloxacin hcl; Nitrofurantoin; and Hydrochlorothiazide    Social History:  The patient  reports that she has never smoked. She has never used smokeless tobacco. She reports that she does not drink alcohol or use drugs.   Family History:  The patient's family history includes Arthritis in her unknown relative; Diabetes in her father and unknown relative; Hypertension in her brother, mother, and unknown relative; Migraines in her unknown relative; Rheumatic fever in her brother; Stroke in her mother and unknown  relative.    ROS: All other systems are reviewed and negative. Unless otherwise mentioned in H&P    PHYSICAL EXAM: VS:  BP 119/78   Pulse 79   Ht 5\' 3"  (1.6 m)   Wt 151 lb (68.5 kg)   BMI 26.75 kg/m  , BMI Body mass index is 26.75 kg/m. GEN: Well nourished, well developed, in no acute distress HEENT: normal Neck: no JVD, carotid bruits, or masses Cardiac: RRR; 2/6 systolic  murmur, rubs, or gallops,no edema  Respiratory:  Clear to auscultation bilaterally, normal work of breathing GI: soft, nontender, nondistended, + BS MS: no deformity or atrophy Skin: warm and dry, no rash Neuro:  Strength and sensation are intact Psych: euthymic mood, full affect   EKG: NSR rate of 73 bpm.   Recent Labs: No results found for requested labs within last 8760 hours.    Lipid Panel No results found for: CHOL, TRIG, HDL, CHOLHDL, VLDL, LDLCALC, LDLDIRECT    Wt Readings from Last 3 Encounters:  10/31/17 151 lb (68.5 kg)  01/16/17 160 lb 12.8 oz (72.9 kg)  12/29/15 152 lb 3.2 oz (69 kg)      Other studies Reviewed: Echocardiogram 10-29-2015 Left ventricle: The cavity size was normal. Wall thickness was   increased in a pattern of moderate LVH. Systolic function was   normal. The estimated ejection fraction was in the range of 55%   to 60%. Wall motion was normal; there were no regional wall   motion abnormalities. Doppler parameters are consistent with   abnormal left ventricular relaxation (grade 1 diastolic   dysfunction). Doppler parameters are consistent with high   ventricular filling pressure. - Aortic valve: Cusp separation was reduced. There was mild   stenosis. There was mild regurgitation. Valve area (VTI): 1.51   cm^2. Valve area (Vmax): 1.51 cm^2. Valve area (Vmean): 1.34   cm^2. - Mitral valve: Calcified annulus. Mildly thickened leaflets .   There was mild regurgitation.  Impressions:  - Normal LV systolic function; moderate LVH; grade 1 diastolic    dysfunction; elevated LV filling pressure; calcified aortic valve   with mild AS by mean gradient (16 mmHg) but visually appears   worse; mild AI; mild MR and TR.  ASSESSMENT AND PLAN:  1. Orthostatic Hypotension: She had significant drop in BP when orthostatics were checked, from 141/83 to 106/60. HR remained stable between 76-& 82 bpm.  As a result of this, I will discontinue amlodipine to allow for permission hypertension. She is recommended to have support hose. Due to use of pain medication and muscle relaxer, I have asked her to be cautious about periods of standing after taking these medications due to hypotensive side effects as well.   She will have BP taken  at home three times a day and daughter having daughter record this.   2. Frequent Palpitations: I will repeat 2 week cardiac monitor to evaluate frequency and duration of palpitations.   3. Moderate AoV stenosis: Uncertain if this is causing some of her symptoms. Allowing for permissive hypertension may influence this. Will need to repeat echo for re-evaluation.   Current medicines are reviewed at length with the patient today.    Labs/ tests ordered today include: Echo   Phill Myron. West Pugh, ANP, AACC   10/31/2017 3:58 PM    [address] Office [phone] Fax [phone]

## 2017-10-31 NOTE — Addendum Note (Signed)
Addended by: Waylan Rocher on: 10/31/2017 04:39 PM   Modules accepted: Orders

## 2017-10-31 NOTE — Patient Instructions (Signed)
Medication Instructions:  STOP AMLODIPINE  If you need a refill on your cardiac medications before your next appointment, please call your pharmacy.  Labwork: If you have labs (blood work) drawn today and your tests are completely normal, you will receive your results only by: Marland Kitchen MyChart Message (if you have MyChart) OR . A paper copy in the mail If you have any lab test that is abnormal or we need to change your treatment, we will call you to review the results.  Testing/Procedures: Your physician has recommended that you wear an 2 WEEK event monitor. This will be placed at our [facility] location - [address].  Event monitors are medical devices that record the heart's electrical activity. Doctors most often [place] these monitors to diagnose arrhythmias. Arrhythmias are problems with the speed or rhythm of the heartbeat. The monitor is a small, portable device. You can wear one while you do your normal daily activities. This is usually used to diagnose what is causing palpitations/syncope (passing out).  Special Instructions: PLEASE TAKE AND LOG YOUR BP THREE TIMES DAILY-PLEASE INDICATE WHEN YOU ARE TAKING PAIN MEDICATION -OR- A MUSCLE RELAXER IN THE COMMENTS SECTION Follow-Up: You will need a follow up appointment in 1 MONTH.  You may see  DR Martinique or one of the following Advanced Practice Providers on your designated Care Team:   . Jory Sims, DNP, ANP . -OR- . Almyra Deforest, PA-C . Fabian Sharp, PA-C  At [facility], you and your health needs are our priority.  As part of our continuing mission to provide you with exceptional heart care, we have created designated Provider Care Teams.  These Care Teams include your primary Cardiologist (physician) and Advanced Practice Providers (APPs -  Physician Assistants and Nurse Practitioners) who all work together to provide you with the care you need, when you need it.  Thank you for choosing [facility] at [facility]!!

## 2017-11-01 ENCOUNTER — Ambulatory Visit: Payer: Medicare Other | Admitting: Adult Health

## 2017-11-01 ENCOUNTER — Ambulatory Visit: Payer: Medicare Other | Admitting: Cardiology

## 2017-11-01 NOTE — Addendum Note (Signed)
Addended by: Leanord Asal T on: 11/01/2017 11:07 AM   Modules accepted: Orders

## 2017-11-08 ENCOUNTER — Ambulatory Visit (HOSPITAL_COMMUNITY): Payer: Medicare Other | Attending: Adult Health

## 2017-11-08 ENCOUNTER — Other Ambulatory Visit: Payer: Self-pay

## 2017-11-08 ENCOUNTER — Ambulatory Visit (INDEPENDENT_AMBULATORY_CARE_PROVIDER_SITE_OTHER): Payer: Medicare Other

## 2017-11-08 DIAGNOSIS — R55 Syncope and collapse: Secondary | ICD-10-CM

## 2017-11-08 DIAGNOSIS — R002 Palpitations: Secondary | ICD-10-CM | POA: Diagnosis not present

## 2017-11-08 DIAGNOSIS — I350 Nonrheumatic aortic (valve) stenosis: Secondary | ICD-10-CM | POA: Diagnosis present

## 2017-12-03 NOTE — Progress Notes (Signed)
Cardiology Office Note   Date:  12/05/2017   ID:  Aimee Ward, DOB 05-19-1933, MRN [id number]  PCP:  Blair Heys, PA-C  Cardiologist: Dr. Martinique Chief Complaint  Patient presents with  . Follow-up    1 month.  . Dizziness  . Aortic Stenosis     History of Present Illness: Aimee Ward is a 84 y.o. female who presents for ongoing assessment and management of mild to moderate aortic valve stenosis, hypertension, hypercholesterolemia, and palpitations.  She has other history to include thyroid disease and arthritis.  She did have a syncopal episode due to hypertensive medications causing hypotension.  This occurred when she was standing for any period of time, becoming dizzy.  It was noted on last office visit that she was also on pain control with hydrocodone and as needed "muscle relaxants" which she takes daily.  On last office visit dated 10/31/2017 the patient had orthostatics completed and was found to be significantly orthostatic with a blood pressure of 141/83 dropping to 106/60.  Amlodipine was discontinued to allow for permissive hypertension.  She was also advised to wear support hose and not use pain medication and muscle relaxers at the same time.  She was also advised to avoid prolonged standing and tell her blood pressure stabilized.  She was to take her blood pressure at home 3 times a day and record, and bring with her to this appointment.  BP has been much better she has not felt dizziness. .No headaches or blurred vision. Cardiac monitor was competed and she was found to be in NSR with average HR of 70 bpm.  Past Medical History:  Diagnosis Date  . Anxiety   . Aortic stenosis, moderate   . CHF (congestive heart failure) ([place]) 2006  . DDD (degenerative disc disease), lumbar   . Full dentures    upper  . GERD (gastroesophageal reflux disease)   . Hearing loss of both ears    wears hearing aides  . Hepatitis 1964  . HTN (hypertension), benign    sees Dr  Peter Martinique annually to check heart valve  . Hypothyroidism   . Macular degeneration, bilateral   . Migraine   . Mixed hyperlipidemia   . Murmur   . Osteoarthritis   . PNA (pneumonia) 2006  . PONV (postoperative nausea and vomiting)   . Syncope   . UTI (lower urinary tract infection)     Past Surgical History:  Procedure Laterality Date  . BACK SURGERY  2004  . CATARACT EXTRACTION    . COLONOSCOPY W/ POLYPECTOMY  2016  . EYE SURGERY     bilateral detatched retina  . FOOT SURGERY    . LUMBAR FUSION  2016  . THYROID SURGERY       Current Outpatient Medications  Medication Sig Dispense Refill  . ALPRAZolam (XANAX) 0.5 MG tablet Take 0.25 mg by mouth at bedtime as needed for anxiety or sleep.     Marland Kitchen ESTRACE VAGINAL 0.1 MG/GM vaginal cream Apply 1 application topically every other day.   3  . HYDROcodone-acetaminophen (NORCO/VICODIN) 5-325 MG tablet Take 1 tablet by mouth every 6 (six) hours as needed.  0  . levothyroxine (SYNTHROID, LEVOTHROID) 88 MCG tablet Take 88 mcg by mouth daily before breakfast.    . loratadine (CLARITIN) 10 MG tablet Take 10 mg by mouth daily.     . meclizine (ANTIVERT) 25 MG tablet Take 1 tablet (25 mg total) by mouth 3 (three) times daily as needed for  dizziness. 15 tablet 0  . methocarbamol (ROBAXIN) 500 MG tablet Take 1 tablet (500 mg total) by mouth every 6 (six) hours as needed for muscle spasms. (Patient taking differently: Take 500 mg by mouth 2 (two) times daily. ) 60 tablet 3  . metoprolol succinate (TOPROL-XL) 25 MG 24 hr tablet Take 1 tablet by mouth 2 (two) times daily.    . Multiple Vitamins-Minerals (PRESERVISION AREDS) CAPS Take 1 capsule by mouth daily. Take 1 tab twice a day    . omeprazole (PRILOSEC) 20 MG capsule Take 20 mg by mouth daily.     . pravastatin (PRAVACHOL) 40 MG tablet Take 80 mg by mouth daily.      No current facility-administered medications for this visit.     Allergies:   Lisinopril; Sulfa antibiotics; Ciprofloxacin  hcl; Nitrofurantoin; and Hydrochlorothiazide    Social History:  The patient  reports that she has never smoked. She has never used smokeless tobacco. She reports that she does not drink alcohol or use drugs.   Family History:  The patient's family history includes Arthritis in her unknown relative; Diabetes in her father and unknown relative; Hypertension in her brother, mother, and unknown relative; Migraines in her unknown relative; Rheumatic fever in her brother; Stroke in her mother and unknown relative.    ROS: All other systems are reviewed and negative. Unless otherwise mentioned in H&P    PHYSICAL EXAM: VS:  BP 128/74 (BP Location: Left Arm, Patient Position: Sitting, Cuff Size: Normal)   Pulse 76   Ht 5\' 3"  (1.6 m)   Wt 150 lb (68 kg)   BMI 26.57 kg/m  , BMI Body mass index is 26.57 kg/m. GEN: Well nourished, well developed, in no acute distress HEENT: normal Neck: no JVD, carotid bruits, or masses Cardiac: RRR; no murmurs, rubs, or gallops,no edema  Respiratory:  Clear to auscultation bilaterally, normal work of breathing GI: soft, nontender, nondistended, + BS MS: no deformity or atrophy Skin: warm and dry, no rash Neuro:  Strength and sensation are intact Psych: euthymic mood, full affect   EKG:  Not completed this office visit.    Recent Labs: No results found for requested labs within last 8760 hours.    Lipid Panel No results found for: CHOL, TRIG, HDL, CHOLHDL, VLDL, LDLCALC, LDLDIRECT    Wt Readings from Last 3 Encounters:  12/05/17 150 lb (68 kg)  10/31/17 151 lb (68.5 kg)  01/16/17 160 lb 12.8 oz (72.9 kg)      Other studies Reviewed: Echocardiogram Nov 29, 2017 Left ventricle: The cavity size was normal. Wall thickness was   increased in a pattern of moderate LVH. Systolic function was   normal. The estimated ejection fraction was in the range of 60%   to 65%. Doppler parameters are consistent with elevated   ventricular end-diastolic filling  pressure. - Aortic valve: There was moderate stenosis. There was mild   regurgitation. Valve area (VTI): 0.89 cm^2. Valve area (Vmax):   0.95 cm^2. Valve area (Vmean): 1 cm^2. - Aorta: Shadowing artifact seen on root - Mitral valve: Calcified annulus. - Atrial septum: No defect or patent foramen ovale was identified.  ASSESSMENT AND PLAN:  1. Orthostatic Hypotension: She is feeling much better after stopping amlodipine. She has an average BP in the 140/70 range. HR in the 70's from home BP recording sheets. Will continue current regimen for now with out changes.  2. Near Syncope: Cardiac monitor was normal. Average HR of 74 bpm without arrhythmia's. This symptom was  likely from orthostatic BP response before discontinuing amlodipine.   3. Moderate AoV stenosis : Echo on 11/08/2017 demonstrated moderate AoV stenosis with mild regurgitation. Valve area 0.89 cm2, Valve area (Vmax) 0.95 cm2. Will continue to monitor with annual echocardiogram.    Current medicines are reviewed at length with the patient today.    Labs/ tests ordered today include: None   Phill Myron. West Pugh, ANP, AACC   12/05/2017 11:51 AM    [address] Office [phone] Fax [phone]

## 2017-12-05 ENCOUNTER — Ambulatory Visit (INDEPENDENT_AMBULATORY_CARE_PROVIDER_SITE_OTHER): Payer: Medicare Other | Admitting: Adult Health

## 2017-12-05 ENCOUNTER — Encounter: Payer: Self-pay | Admitting: Adult Health

## 2017-12-05 VITALS — BP 128/74 | HR 76 | Ht 63.0 in | Wt 150.0 lb

## 2017-12-05 DIAGNOSIS — I350 Nonrheumatic aortic (valve) stenosis: Secondary | ICD-10-CM | POA: Diagnosis not present

## 2017-12-05 DIAGNOSIS — I951 Orthostatic hypotension: Secondary | ICD-10-CM | POA: Diagnosis not present

## 2017-12-05 DIAGNOSIS — I10 Essential (primary) hypertension: Secondary | ICD-10-CM

## 2017-12-05 NOTE — Patient Instructions (Signed)
Follow-Up: You will need a follow up appointment in 4 months.  Please call our office 2 months in advance(feb 2020) to schedule the (April 2020) appointment.  You may see  DR Martinique, Kathryn Lawrence, DNP, AACC -or- one of the following Advanced Practice Providers on your designated Care Team:    . Almyra Deforest, PA-C . Fabian Sharp, PA-C  Medication Instructions:  NO CHANGES- Your physician recommends that you continue on your current medications as directed. Please refer to the Current Medication list given to you today. If you need a refill on your cardiac medications before your next appointment, please call your pharmacy.  Labwork: If you have labs (blood work) drawn today and your tests are completely normal, you will receive your results ONLY by: . MyChart Message (if you have MyChart) -OR- . A paper copy in the mail At [facility], you and your health needs are our priority.  As part of our continuing mission to provide you with exceptional heart care, we have created designated Provider Care Teams.  These Care Teams include your primary Cardiologist (physician) and Advanced Practice Providers (APPs -  Physician Assistants and Nurse Practitioners) who all work together to provide you with the care you need, when you need it.  Thank you for choosing [facility] at [facility]!!

## 2017-12-18 ENCOUNTER — Encounter: Payer: Self-pay | Admitting: Psychology

## 2018-01-25 ENCOUNTER — Encounter: Payer: Self-pay | Admitting: Cardiology

## 2018-02-08 NOTE — Progress Notes (Signed)
Aimee Ward Date of Birth: October 20, 1933 Medical Record #[id number]  History of Present Illness: Aimee Ward is seen for followup of AS. She has HTN and HLD. She has a history of moderate aortic stenosis. She has a prior history of near syncope that resolved with reduction in BP meds. Event monitor showed few PACs and PVCs. She was seen in October by Bunnie Domino NP for evaluation of near syncope. She had orthostatic hypotension that improved with stopping amlodipine. Event monitor showed rare PVCs- otherwise normal.   On follow up today she is feeling very well.    She is living with her daughter and is really quite inactive. She has difficulty with her vision now due to macular degeneration. Has had UTIs and sinus infections. Denies any SOB, chest pain or recurrent dizziness.    Current Outpatient Medications  Medication Sig Dispense Refill  . ALPRAZolam (XANAX) 0.5 MG tablet Take 0.25 mg by mouth at bedtime as needed for anxiety or sleep.     . cholecalciferol (VITAMIN D3) 25 MCG (1000 UT) tablet Take 1,000 Units by mouth daily.    . clindamycin (CLEOCIN) 300 MG capsule Take 300 mg by mouth 3 (three) times daily.    . diclofenac (VOLTAREN) 75 MG EC tablet Take 75 mg by mouth 2 (two) times daily.  1  . ESTRACE VAGINAL 0.1 MG/GM vaginal cream Apply 1 application topically every other day.   3  . fluorouracil (EFUDEX) 5 % cream Apply 1 application topically daily as needed.    . gabapentin (NEURONTIN) 100 MG capsule Take 100 mg by mouth 3 (three) times daily.    Marland Kitchen HYDROcodone-acetaminophen (NORCO/VICODIN) 5-325 MG tablet Take 1 tablet by mouth every 6 (six) hours as needed.  0  . hydrocortisone 2.5 % cream Apply 1 application topically 2 (two) times daily as needed.    Marland Kitchen levothyroxine (SYNTHROID, LEVOTHROID) 88 MCG tablet Take 88 mcg by mouth daily before breakfast.    . lidocaine (XYLOCAINE) 2 % solution Take 5 mLs by mouth 4 (four) times daily as needed.    . loratadine (CLARITIN)  10 MG tablet Take 10 mg by mouth daily.     . methenamine (HIPREX) 1 g tablet Take 1 g by mouth daily.    . methocarbamol (ROBAXIN) 750 MG tablet Take 750 mg by mouth every 6 (six) hours as needed for muscle spasms.    . metoprolol succinate (TOPROL-XL) 50 MG 24 hr tablet Take 50 mg by mouth daily. Take with or immediately following a meal.    . Multiple Vitamins-Minerals (PRESERVISION AREDS) CAPS Take 1 capsule by mouth daily. Take 1 tab twice a day    . nystatin-triamcinolone ointment (MYCOLOG) Apply 1 application topically 2 (two) times daily as needed.    Marland Kitchen omeprazole (PRILOSEC) 40 MG capsule Take 40 mg by mouth daily.    . pravastatin (PRAVACHOL) 80 MG tablet Take 80 mg by mouth daily.    . sertraline (ZOLOFT) 100 MG tablet Take 100 mg by mouth daily.    Marland Kitchen terbinafine (LAMISIL) 1 % cream Apply 1 application topically 2 (two) times daily as needed.    . vitamin C (ASCORBIC ACID) 500 MG tablet Take 500 mg by mouth daily.     No current facility-administered medications for this visit.     Allergies  Allergen Reactions  . Lisinopril Itching    itching Other  . Sulfa Antibiotics Anaphylaxis  . Ciprofloxacin Hcl     Other reaction(s): Other Muscle pain  .  Nitrofurantoin Itching    Other Other  . Hydrochlorothiazide Other (See Comments)    Cramps  Cramps Cramps Other    Past Medical History:  Diagnosis Date  . Anxiety   . Aortic stenosis, moderate   . CHF (congestive heart failure) ([place]) 2006  . DDD (degenerative disc disease), lumbar   . Full dentures    upper  . GERD (gastroesophageal reflux disease)   . Hearing loss of both ears    wears hearing aides  . Hepatitis 1964  . HTN (hypertension), benign    sees Dr Erle Guster Martinique annually to check heart valve  . Hypothyroidism   . Macular degeneration, bilateral   . Migraine   . Mixed hyperlipidemia   . Murmur   . Osteoarthritis   . PNA (pneumonia) 2006  . PONV (postoperative nausea and vomiting)   . Syncope   .  UTI (lower urinary tract infection)     Past Surgical History:  Procedure Laterality Date  . BACK SURGERY  2004  . CATARACT EXTRACTION    . COLONOSCOPY W/ POLYPECTOMY  2016  . EYE SURGERY     bilateral detatched retina  . FOOT SURGERY    . LUMBAR FUSION  2016  . THYROID SURGERY      Social History   Tobacco Use  Smoking Status Never Smoker  Smokeless Tobacco Never Used    Social History   Substance and Sexual Activity  Alcohol Use No    Family History  Problem Relation Age of Onset  . Hypertension Mother   . Stroke Mother   . Diabetes Father   . Hypertension Brother   . Rheumatic fever Brother   . Diabetes Other   . Hypertension Other   . Stroke Other   . Arthritis Other   . Migraines Other     Review of Systems: The review of systems is per the HPI.  All other systems were reviewed and are negative.  Physical Exam: BP 122/70   Pulse 72   Ht 5\' 3"  (1.6 m)   Wt 146 lb (66.2 kg)   SpO2 94%   BMI 25.86 kg/m  GENERAL:  Well appearing elderly WF in NAD HEENT:  PERRL, EOMI, sclera are clear. Oropharynx is clear. NECK:  No jugular venous distention, carotid upstroke brisk and symmetric, no bruits, no thyromegaly or adenopathy LUNGS:  Clear to auscultation bilaterally CHEST:  Unremarkable HEART:  RRR,  PMI not displaced or sustained,S1 and S2 within normal limits, no S3, no S4: no clicks, no rubs, gr 2/6 systolic murmur ABD:  Soft, nontender. BS +, no masses or bruits. No hepatomegaly, no splenomegaly EXT:  2 + pulses throughout, no edema, no cyanosis no clubbing SKIN:  Warm and dry.  No rashes NEURO:  Alert and oriented x 3. Cranial nerves II through XII intact. PSYCH:  Cognitively intact      LABORATORY DATA:  Lab Results  Component Value Date   WBC 5.2 04/04/2015   HGB 11.8 (L) 04/04/2015   HCT 35.7 (L) 04/04/2015   PLT 203 04/04/2015   GLUCOSE 117 (H) 04/04/2015   ALT 31 02/16/2015   AST 29 02/16/2015   NA 136 04/04/2015   K 3.9 04/04/2015     CL 106 04/04/2015   CREATININE 0.73 04/04/2015   BUN 11 04/04/2015   CO2 22 04/04/2015    Labs dated 05/27/15: cholesterol 146, triglycerides 127, HDL 46, LDL 75. CMET and CBC normal. Dated 06/08/16: cholesterol 145, triglycerides 118, HDL 45, LDL  76. CBC normal. CMET in October 2018 normal.  Echo Study :10/26/15 Study Conclusions  - Left ventricle: The cavity size was normal. Wall thickness was   increased in a pattern of moderate LVH. Systolic function was   normal. The estimated ejection fraction was in the range of 55%   to 60%. Wall motion was normal; there were no regional wall   motion abnormalities. Doppler parameters are consistent with   abnormal left ventricular relaxation (grade 1 diastolic   dysfunction). Doppler parameters are consistent with high   ventricular filling pressure. - Aortic valve: Cusp separation was reduced. There was mild   stenosis. There was mild regurgitation. Valve area (VTI): 1.51   cm^2. Valve area (Vmax): 1.51 cm^2. Valve area (Vmean): 1.34   cm^2. - Mitral valve: Calcified annulus. Mildly thickened leaflets .   There was mild regurgitation.  Impressions:  - Normal LV systolic function; moderate LVH; grade 1 diastolic   dysfunction; elevated LV filling pressure; calcified aortic valve   with mild AS by mean gradient (16 mmHg) but visually appears   worse; mild AI; mild MR and TR.  Echo 11/08/17: Study Conclusions  - Left ventricle: The cavity size was normal. Wall thickness was   increased in a pattern of moderate LVH. Systolic function was   normal. The estimated ejection fraction was in the range of 60%   to 65%. Doppler parameters are consistent with elevated   ventricular end-diastolic filling pressure. - Aortic valve: There was moderate stenosis. There was mild   regurgitation. Valve area (VTI): 0.89 cm^2. Valve area (Vmax):   0.95 cm^2. Valve area (Vmean): 1 cm^2. - Aorta: Shadowing artifact seen on root - Mitral valve:  Calcified annulus. - Atrial septum: No defect or patent foramen ovale was identified.   Assessment / Plan: 1.  Moderate aortic stenosis. Mean gradient has increased from 16>>29 since 2017. Valve area decreased from 1.34 >>0.95 cm squared. She has no significant symptoms. Will monitor.   2. Orthostatic hypotension. Improved with stopping amlodipine. Continue Troprol XL only for BP.   3. Hypertension-well-controlled.  4. Rare PVCs.   I will see in one year

## 2018-02-12 ENCOUNTER — Encounter: Payer: Self-pay | Admitting: Cardiology

## 2018-02-12 ENCOUNTER — Ambulatory Visit (INDEPENDENT_AMBULATORY_CARE_PROVIDER_SITE_OTHER): Payer: Medicare Other | Admitting: Cardiology

## 2018-02-12 VITALS — BP 122/70 | HR 72 | Ht 63.0 in | Wt 146.0 lb

## 2018-02-12 DIAGNOSIS — I10 Essential (primary) hypertension: Secondary | ICD-10-CM | POA: Diagnosis not present

## 2018-02-12 DIAGNOSIS — I951 Orthostatic hypotension: Secondary | ICD-10-CM

## 2018-02-12 DIAGNOSIS — I350 Nonrheumatic aortic (valve) stenosis: Secondary | ICD-10-CM | POA: Diagnosis not present

## 2018-02-22 ENCOUNTER — Encounter: Payer: Medicare Other | Attending: Psychology | Admitting: Psychology

## 2018-02-22 DIAGNOSIS — F09 Unspecified mental disorder due to known physiological condition: Secondary | ICD-10-CM

## 2018-02-22 DIAGNOSIS — R413 Other amnesia: Secondary | ICD-10-CM | POA: Diagnosis present

## 2018-02-22 DIAGNOSIS — F0789 Other personality and behavioral disorders due to known physiological condition: Secondary | ICD-10-CM | POA: Diagnosis present

## 2018-03-16 ENCOUNTER — Encounter: Payer: Medicare Other | Admitting: Psychology

## 2018-03-25 ENCOUNTER — Encounter: Payer: Self-pay | Admitting: Psychology

## 2018-03-25 NOTE — Progress Notes (Signed)
Neuropsychological Consultation   Patient:   Aimee Ward   DOB:   [birth year]  MR Number:  [id number]  Location:  [facility] [place], [place] [id number] [place] [address] Dept: [phone]           Date of Service:   02/22/2018  Start Time:   11 AM End Time:   12 PM  Provider/Observer:  Ilean Skill, Psy.D.       Clinical Neuropsychologist       Billing Code/Service: 83419 4 Units  Chief Complaint:    Aimee Ward is an 85 year old female referred by Dr. Zadie Rhine for neuropsychological evaluation.  Along with vision loss making it hard for the patient to read, CTV or make out faces the patient's daughter reports that there have been progressive memory changes and falls recently.  The patient is also had at least one occurrence of significant geographic disorientation.  Both the patient and her daughter are denying severe memory changes but there are some concerns about these progressive changes.  Reason for Service:  Aimee Ward is an 85 year old female referred by Dr. Zadie Rhine for neuropsychological evaluation.  Along with vision loss making it hard for the patient to read, CTV or make out faces the patient's daughter reports that there have been progressive memory changes and falls recently.  The patient is also had at least one occurrence of significant geographic disorientation.  Both the patient and her daughter are denying severe memory changes but there are some concerns about these progressive changes.  Along with these cognitive changes the patient is also been dealing with hypertension as well as past issues with postural hypotension.  Current Status:  The patient describes primarily vision changes in vision loss but the daughter is concerned about issues with memory and at least one occasion of significant geographic disorientation.  Reliability of  Information: The information is derived from 1 hour face-to-face clinical interview with the patient as well as review of available medical records.  Behavioral Observation: Aimee Ward  presents as a 85 y.o.-year-old Right Caucasian Female who appeared her stated age. her dress was Appropriate and she was Well Groomed and her manners were Appropriate to the situation.  her participation was indicative of Appropriate and Redirectable behaviors.  There were not any physical disabilities noted.  she displayed an appropriate level of cooperation and motivation.     Interactions:    Active Appropriate and Redirectable  Attention:   abnormal and attention span appeared shorter than expected for age  Memory:   abnormal; remote memory intact, recent memory impaired  Visuo-spatial:  not examined  Speech (Volume):  low  Speech:   normal; normal  Thought Process:  Coherent and Relevant  Though Content:  WNL; not suicidal and not homicidal  Orientation:   person, place, time/date and situation  Judgment:   Fair  Planning:   Fair  Affect:    Appropriate  Mood:    Dysphoric  Insight:   Fair  Intelligence:   normal  Marital Status/Living: The patient was born and raised in [place] and grew up with 3 brothers.  The patient currently resides with her daughter and has been living there for the past 17 years.  Current Employment: The patient is retired.  Substance Use:  No concerns of substance abuse are reported.    Education:   HS Graduate  Medical History:   Past Medical  History:  Diagnosis Date  . Anxiety   . Aortic stenosis, moderate   . CHF (congestive heart failure) ([place]) 2006  . DDD (degenerative disc disease), lumbar   . Full dentures    upper  . GERD (gastroesophageal reflux disease)   . Hearing loss of both ears    wears hearing aides  . Hepatitis 1964  . HTN (hypertension), benign    sees Dr Peter Martinique annually to check heart valve  .  Hypothyroidism   . Macular degeneration, bilateral   . Migraine   . Mixed hyperlipidemia   . Murmur   . Osteoarthritis   . PNA (pneumonia) 2006  . PONV (postoperative nausea and vomiting)   . Syncope   . UTI (lower urinary tract infection)         Abuse/Trauma History: No significant abuse or history of traumatic experiences noted  Psychiatric History:  The patient has no prior psychiatric..  Family Med/Psych History:  Family History  Problem Relation Age of Onset  . Hypertension Mother   . Stroke Mother   . Diabetes Father   . Hypertension Brother   . Rheumatic fever Brother   . Diabetes Other   . Hypertension Other   . Stroke Other   . Arthritis Other   . Migraines Other     Risk of Suicide/Violence: low the patient denies any suicidal or homicidal ideation.  Impression/DX:  Aimee Ward is an 85 year old female referred by Dr. Zadie Rhine for neuropsychological evaluation.  Along with vision loss making it hard for the patient to read, CTV or make out faces the patient's daughter reports that there have been progressive memory changes and falls recently.  The patient is also had at least one occurrence of significant geographic disorientation.  Both the patient and her daughter are denying severe memory changes but there are some concerns about these progressive changes.  Along with these cognitive changes the patient is also been dealing with hypertension as well as past issues with postural hypotension.  Disposition/Plan:  We have set the patient up for formal neuropsychological evaluation utilizing the [facility] neuropsychological test battery.  Diagnosis:    Memory loss  Cognitive and neurobehavioral dysfunction         Electronically Signed   _______________________ Ilean Skill, Psy.D.

## 2018-05-24 ENCOUNTER — Ambulatory Visit: Payer: Medicare Other | Admitting: Psychology

## 2018-05-28 ENCOUNTER — Ambulatory Visit: Payer: Medicare Other | Admitting: Psychology

## 2018-08-16 ENCOUNTER — Ambulatory Visit: Payer: Medicare Other | Admitting: Physician Assistant

## 2018-12-09 NOTE — Progress Notes (Deleted)
Cardiology Office Note:    Date:  12/09/2018   ID:  Aimee Ward, DOB 1933-09-10, MRN [id number]  PCP:  Blair Heys, PA-C  Cardiologist:  Peter Martinique, MD   Referring MD: Blair Heys, PA-C   No chief complaint on file. ***  History of Present Illness:    Aimee Ward is a 85 y.o. female with a hx of HTN, moderate AS, and HLD. She has a history of near syncope that resolved with reduction of BP medications. She also has a history of orthostatic hypotension, resolved with discontinuation of amlodipine. Event monitor showed PVCs and PACs. She was last seen in clinic by Dr. Martinique on 02/12/18. She was doing well at that time from a cardiac perspective. Echo showed a progression in her AS to moderate with an increase in mean gradient  16 --> 29 and valve area decreased from 1.34 --> 0.95. She was asymptomatic - this will be monitored.      Moderate AS - repeat echo   Hypertension  Orthostatic hypotension Rare PVCs Continue toprol.      Past Medical History:  Diagnosis Date  . Anxiety   . Aortic stenosis, moderate   . CHF (congestive heart failure) ([place]) 2006  . DDD (degenerative disc disease), lumbar   . Full dentures    upper  . GERD (gastroesophageal reflux disease)   . Hearing loss of both ears    wears hearing aides  . Hepatitis 1964  . HTN (hypertension), benign    sees Dr Peter Martinique annually to check heart valve  . Hypothyroidism   . Macular degeneration, bilateral   . Migraine   . Mixed hyperlipidemia   . Murmur   . Osteoarthritis   . PNA (pneumonia) 2006  . PONV (postoperative nausea and vomiting)   . Syncope   . UTI (lower urinary tract infection)     Past Surgical History:  Procedure Laterality Date  . BACK SURGERY  2004  . CATARACT EXTRACTION    . COLONOSCOPY W/ POLYPECTOMY  2016  . EYE SURGERY     bilateral detatched retina  . FOOT SURGERY    . LUMBAR FUSION  2016  . THYROID SURGERY      Current Medications: No outpatient  medications have been marked as taking for the 12/10/18 encounter (Appointment) with Ledora Bottcher, [place].     Allergies:   Lisinopril, Sulfa antibiotics, Ciprofloxacin hcl, Nitrofurantoin, and Hydrochlorothiazide   Social History   Socioeconomic History  . Marital status: Widowed    Spouse name: Not on file  . Number of children: 4  . Years of education: Not on file  . Highest education level: Not on file  Occupational History  . Not on file  Social Needs  . Financial resource strain: Not on file  . Food insecurity    Worry: Not on file    Inability: Not on file  . Transportation needs    Medical: Not on file    Non-medical: Not on file  Tobacco Use  . Smoking status: Never Smoker  . Smokeless tobacco: Never Used  Substance and Sexual Activity  . Alcohol use: No  . Drug use: No  . Sexual activity: Never  Lifestyle  . Physical activity    Days per week: Not on file    Minutes per session: Not on file  . Stress: Not on file  Relationships  . Social Herbalist on phone: Not on file    Gets together:  Not on file    Attends religious service: Not on file    Active member of club or organization: Not on file    Attends meetings of clubs or organizations: Not on file    Relationship status: Not on file  Other Topics Concern  . Not on file  Social History Narrative  . Not on file     Family History: The patient's ***family history includes Arthritis in an other family member; Diabetes in her father and another family member; Hypertension in her brother, mother, and another family member; Migraines in an other family member; Rheumatic fever in her brother; Stroke in her mother and another family member.  ROS:   Please see the history of present illness.    *** All other systems reviewed and are negative.  EKGs/Labs/Other Studies Reviewed:    The following studies were reviewed today: ***  EKG:  EKG is *** ordered today.  The ekg ordered today  demonstrates ***  Recent Labs: No results found for requested labs within last 8760 hours.  Recent Lipid Panel No results found for: CHOL, TRIG, HDL, CHOLHDL, VLDL, LDLCALC, LDLDIRECT  Physical Exam:    VS:  There were no vitals taken for this visit.    Wt Readings from Last 3 Encounters:  02/12/18 146 lb (66.2 kg)  12/05/17 150 lb (68 kg)  10/31/17 151 lb (68.5 kg)     GEN: *** Well nourished, well developed in no acute distress HEENT: Normal NECK: No JVD; No carotid bruits LYMPHATICS: No lymphadenopathy CARDIAC: ***RRR, no murmurs, rubs, gallops RESPIRATORY:  Clear to auscultation without rales, wheezing or rhonchi  ABDOMEN: Soft, non-tender, non-distended MUSCULOSKELETAL:  No edema; No deformity  SKIN: Warm and dry NEUROLOGIC:  Alert and oriented x 3 PSYCHIATRIC:  Normal affect   ASSESSMENT:    No diagnosis found. PLAN:    In order of problems listed above:  No diagnosis found.   Medication Adjustments/Labs and Tests Ordered: Current medicines are reviewed at length with the patient today.  Concerns regarding medicines are outlined above.  No orders of the defined types were placed in this encounter.  No orders of the defined types were placed in this encounter.   Signed, Ledora Bottcher, [place]  12/09/2018 9:44 PM    [facility]

## 2018-12-10 ENCOUNTER — Ambulatory Visit: Payer: Medicare Other | Admitting: Physician Assistant

## 2018-12-10 ENCOUNTER — Telehealth: Payer: Self-pay | Admitting: Cardiology

## 2018-12-10 NOTE — Telephone Encounter (Signed)
New Message  Pt c/o of Chest Pain: STAT if CP now or developed within 24 hours  1. Are you having CP right now? Yes  2. Are you experiencing any other symptoms (ex. SOB, nausea, vomiting, sweating)? Nausea, vomiting, SOB  3. How long have you been experiencing CP? Through the weekend  4. Is your CP continuous or coming and going? continuous  5. Have you taken Nitroglycerin? Anything taken is thrown back up ?

## 2018-12-10 NOTE — Telephone Encounter (Signed)
I really don't feel comfortable prescribing antiemetics given potential interaction with other meds (gabapentin, zoloft, Xanax). This should really be taken care of by primary care.  Coury Grieger Martinique MD, [facility]

## 2018-12-10 NOTE — Telephone Encounter (Signed)
Follow Up  Attempted to contact triage to speak to patient regarding chest pains. Could not reach a nurse. Sent message to triage.  Please call patient asap.

## 2018-12-10 NOTE — Telephone Encounter (Signed)
Returned call to patient Dr.Jordan's advice given.Stated PCP already called something in.Stated she has already took one and she is feeling better.

## 2018-12-10 NOTE — Telephone Encounter (Signed)
Called and spoke to daughter- advised that she is having the chest pain currently- but no other cardiac symptoms, she is no SOB, no arm pain, neck pain, swelling, weight gain- patient has been throwing up for and nauseated since Thursday- I advised it sounded like a stomach bug or food poisioning as she has not been able to keep anything down, not even her medications. Patient daughter states she believes she is just sore in her chest from getting sick and I agreed. Patient daughter states they contacted her PCP, but they want her to get COVID tested in [place] and she can not get her into the car to go anywhere right now where she is so weak- I advised to make sure patient was drinking plenty of fluids - if not urinating to take her to ER as she could be dehydrated as well.   She would like to see if Dr.Jordan would call in some nausea medication to pharmacy maybe something that dissolves since she is not keeping things down well right now- I advised normally that comes from PCP but they have yet to hear back from them, and they have called and called with no response. Will route to MD to make aware and see if okay to call in med to [place].

## 2018-12-28 ENCOUNTER — Emergency Department (HOSPITAL_COMMUNITY): Payer: Medicare Other

## 2018-12-28 ENCOUNTER — Encounter (HOSPITAL_COMMUNITY): Payer: Self-pay | Admitting: Emergency Medicine

## 2018-12-28 ENCOUNTER — Emergency Department (HOSPITAL_COMMUNITY)
Admission: EM | Admit: 2018-12-28 | Discharge: 2018-12-28 | Disposition: A | Discharge status: Home / Self Care | Payer: Medicare Other | Attending: Emergency Medicine | Admitting: Emergency Medicine

## 2018-12-28 ENCOUNTER — Other Ambulatory Visit: Payer: Self-pay

## 2018-12-28 DIAGNOSIS — I509 Heart failure, unspecified: Secondary | ICD-10-CM | POA: Diagnosis not present

## 2018-12-28 DIAGNOSIS — Z79899 Other long term (current) drug therapy: Secondary | ICD-10-CM | POA: Diagnosis not present

## 2018-12-28 DIAGNOSIS — R5383 Other fatigue: Secondary | ICD-10-CM | POA: Insufficient documentation

## 2018-12-28 DIAGNOSIS — K2900 Acute gastritis without bleeding: Secondary | ICD-10-CM | POA: Diagnosis not present

## 2018-12-28 DIAGNOSIS — R410 Disorientation, unspecified: Secondary | ICD-10-CM | POA: Insufficient documentation

## 2018-12-28 DIAGNOSIS — I110 Hypertensive heart disease with heart failure: Secondary | ICD-10-CM | POA: Insufficient documentation

## 2018-12-28 DIAGNOSIS — N3000 Acute cystitis without hematuria: Secondary | ICD-10-CM | POA: Diagnosis not present

## 2018-12-28 DIAGNOSIS — R112 Nausea with vomiting, unspecified: Secondary | ICD-10-CM | POA: Diagnosis not present

## 2018-12-28 DIAGNOSIS — R1110 Vomiting, unspecified: Secondary | ICD-10-CM | POA: Diagnosis present

## 2018-12-28 DIAGNOSIS — E039 Hypothyroidism, unspecified: Secondary | ICD-10-CM | POA: Diagnosis not present

## 2018-12-28 HISTORY — DX: Anemia, unspecified: D64.9

## 2018-12-28 LAB — CBC
HCT: 36.1 % (ref 36.0–46.0)
Hemoglobin: 10.6 g/dL — ABNORMAL LOW (ref 12.0–15.0)
MCH: 25.5 pg — ABNORMAL LOW (ref 26.0–34.0)
MCHC: 29.4 g/dL — ABNORMAL LOW (ref 30.0–36.0)
MCV: 86.8 fL (ref 80.0–100.0)
Platelets: 353 10*3/uL (ref 150–400)
RBC: 4.16 MIL/uL (ref 3.87–5.11)
RDW: 18.7 % — ABNORMAL HIGH (ref 11.5–15.5)
WBC: 6.5 10*3/uL (ref 4.0–10.5)
nRBC: 0 % (ref 0.0–0.2)

## 2018-12-28 LAB — COMPREHENSIVE METABOLIC PANEL
ALT: 11 U/L (ref 0–44)
AST: 17 U/L (ref 15–41)
Albumin: 3.1 g/dL — ABNORMAL LOW (ref 3.5–5.0)
Alkaline Phosphatase: 81 U/L (ref 38–126)
Anion gap: 8 (ref 5–15)
BUN: 12 mg/dL (ref 8–23)
CO2: 26 mmol/L (ref 22–32)
Calcium: 8.9 mg/dL (ref 8.9–10.3)
Chloride: 102 mmol/L (ref 98–111)
Creatinine, Ser: 1.01 mg/dL — ABNORMAL HIGH (ref 0.44–1.00)
GFR calc Af Amer: 60 mL/min — ABNORMAL LOW (ref >60)
GFR calc non Af Amer: 51 mL/min — ABNORMAL LOW (ref >60)
Glucose, Bld: 101 mg/dL — ABNORMAL HIGH (ref 70–99)
Potassium: 4.5 mmol/L (ref 3.5–5.1)
Sodium: 136 mmol/L (ref 135–145)
Total Bilirubin: 0.2 mg/dL — ABNORMAL LOW (ref 0.3–1.2)
Total Protein: 7.5 g/dL (ref 6.5–8.1)

## 2018-12-28 LAB — URINALYSIS, ROUTINE W REFLEX MICROSCOPIC
Bilirubin Urine: NEGATIVE
Glucose, UA: NEGATIVE mg/dL
Ketones, ur: NEGATIVE mg/dL
Nitrite: POSITIVE — AB
Protein, ur: NEGATIVE mg/dL
Specific Gravity, Urine: 1.011 (ref 1.005–1.030)
WBC, UA: >50 WBC/hpf — ABNORMAL HIGH (ref 0–5)
pH: 5 (ref 5.0–8.0)

## 2018-12-28 LAB — LIPASE, BLOOD: Lipase: 31 U/L (ref 11–51)

## 2018-12-28 MED ORDER — SUCRALFATE 1 GM/10ML PO SUSP: 1.0000 g | Freq: Three times a day (TID) | ORAL | 1 refills | Status: DC

## 2018-12-28 MED ORDER — FAMOTIDINE 20 MG PO TABS: 20.0000 mg | ORAL_TABLET | Freq: Two times a day (BID) | ORAL | 0 refills | Status: DC

## 2018-12-28 MED ORDER — ONDANSETRON HCL 4 MG/2ML IJ SOLN
4.0000 mg | Freq: Once | INTRAMUSCULAR | Status: AC
  Administered 2018-12-28: 4 mg via INTRAVENOUS
  Filled 2018-12-28: qty 2

## 2018-12-28 MED ORDER — CEPHALEXIN 500 MG PO CAPS
500.0000 mg | ORAL_CAPSULE | Freq: Once | ORAL | Status: AC
  Administered 2018-12-28: 500 mg via ORAL
  Filled 2018-12-28: qty 1

## 2018-12-28 MED ORDER — CEPHALEXIN 500 MG PO CAPS: 500.0000 mg | ORAL_CAPSULE | Freq: Two times a day (BID) | ORAL | 0 refills | Status: DC

## 2018-12-28 MED ORDER — SODIUM CHLORIDE 0.9 % IV BOLUS
500.0000 mL | Freq: Once | INTRAVENOUS | Status: AC
  Administered 2018-12-28: 500 mL via INTRAVENOUS

## 2018-12-28 MED ORDER — SODIUM CHLORIDE 0.9% FLUSH
3.0000 mL | Freq: Once | INTRAVENOUS | Status: AC
  Administered 2018-12-28: 3 mL via INTRAVENOUS

## 2018-12-28 MED ORDER — IOHEXOL 300 MG/ML  SOLN
100.0000 mL | Freq: Once | INTRAMUSCULAR | Status: AC | PRN
  Administered 2018-12-28: 100 mL via INTRAVENOUS

## 2018-12-28 NOTE — ED Triage Notes (Signed)
Family reports patient has had problems with nausea and vomiting since before Thanksgiving. Over the past few days patient has been unable to keep anything down, has become disoriented and lethargic. Family states she was up all last night vomiting despite nausea medications.

## 2018-12-28 NOTE — ED Notes (Signed)
To CT

## 2018-12-28 NOTE — Discharge Instructions (Signed)
Please read and follow all provided instructions.  Your diagnoses today include:  1. Non-intractable vomiting with nausea, unspecified vomiting type   2. Acute cystitis without hematuria   3. Acute gastritis without hemorrhage, unspecified gastritis type     Tests performed today include:  Blood counts and electrolytes  Blood tests to check liver and kidney function  Blood tests to check pancreas function  Urine test to look for infection - shows infection, urine culture is pending  CT scan of your brain -  no problems to explain nausea and vomiting   CT scan of your abdomen and pelvis -suggest a bladder infection as well as possible inflammation in the stomach  Vital signs. See below for your results today.   Medications prescribed:   Pepcid (famotidine) - antihistamine  You can find this medication over-the-counter.   DO NOT exceed:   20mg  Pepcid every 12 hours   Carafate - for stomach upset and to protect your stomach   Keflex (cephalexin) - antibiotic for urinary tract infection  You have been prescribed an antibiotic medicine: take the entire course of medicine even if you are feeling better. Stopping early can cause the antibiotic not to work.  Take any prescribed medications only as directed.  Home care instructions:   Follow any educational materials contained in this packet.  Please stop taking the diclofenac for pain temporarily as this can make your gastritis worse.  Use Tylenol as directed on the packaging for pain.   Follow-up instructions: Please follow-up with your primary care provider and gastroenterologist as planned.  Return instructions:  SEEK IMMEDIATE MEDICAL ATTENTION IF:  The pain does not go away or becomes severe   A temperature above 101F develops   Repeated vomiting occurs (multiple episodes)   The pain becomes localized to portions of the abdomen. The right side could possibly be appendicitis. In an adult, the left lower  portion of the abdomen could be colitis or diverticulitis.   Blood is being passed in stools or vomit (bright red or black tarry stools)   You develop chest pain, difficulty breathing, dizziness or fainting, or become confused, poorly responsive, or inconsolable (young children)  If you have any other emergent concerns regarding your health  Additional Information: Abdominal (belly) pain can be caused by many things. Your caregiver performed an examination and possibly ordered blood/urine tests and imaging (CT scan, x-rays, ultrasound). Many cases can be observed and treated at home after initial evaluation in the emergency department. Even though you are being discharged home, abdominal pain can be unpredictable. Therefore, you need a repeated exam if your pain does not resolve, returns, or worsens. Most patients with abdominal pain don't have to be admitted to the hospital or have surgery, but serious problems like appendicitis and gallbladder attacks can start out as nonspecific pain. Many abdominal conditions cannot be diagnosed in one visit, so follow-up evaluations are very important.  Your vital signs today were: BP (!) 166/81 (BP Location: Right Arm)   Pulse 78   Temp 98.3 F (36.8 C) (Oral)   Resp 16   Ht 5\' 3"  (1.6 m)   Wt 65.3 kg   SpO2 97%   BMI 25.51 kg/m  If your blood pressure (bp) was elevated above 135/85 this visit, please have this repeated by your doctor within one month. --------------

## 2018-12-28 NOTE — ED Notes (Signed)
Pt reports N/V at night since October when she lies down  None during the day and none today   Has appt with GI doctor next [facility]

## 2018-12-28 NOTE — ED Provider Notes (Signed)
[facility] EMERGENCY DEPARTMENT Provider Note   CSN: [id number] Arrival date & time: 12/28/18  1150     History Chief Complaint  Patient presents with   Emesis    Aimee Ward is a 85 y.o. female.  Patient with history of diverticulosis, GERD --presents to the emergency department with complaint of daily episodes of vomiting ongoing for the past 3 to 4 weeks.  Symptoms started around Thanksgiving.  Patient initially thought that her symptoms were related to a new iron supplement that she was taking.  She has adjusted the time of day that she takes this but her symptoms have persisted.  She has nausea and vomiting at any times during the day however it does seem worse at night.  She has some bloating with episodes of vomiting.  Vomiting has been nonbloody, nonbilious this entire time.  She has had normal bowel movements up to 3 or 4 days ago when she has developed constipation and is not passing gas.  No history of previous abdominal surgeries.  She does not have any significant chest pain or worsening shortness of breath over this time.  No reported fevers, cough.  No current urinary symptoms.  She states that she just feels very weak.  She states that she has an appointment with gastroenterology next week.        Past Medical History:  Diagnosis Date   Anemia    Anxiety    Aortic stenosis, moderate    CHF (congestive heart failure) ([place]) 2006   DDD (degenerative disc disease), lumbar    Full dentures    upper   GERD (gastroesophageal reflux disease)    Hearing loss of both ears    wears hearing aides   Hepatitis 1964   HTN (hypertension), benign    sees Dr Peter Martinique annually to check heart valve   Hypothyroidism    Macular degeneration, bilateral    Migraine    Mixed hyperlipidemia    Murmur    Osteoarthritis    PNA (pneumonia) 2006   PONV (postoperative nausea and vomiting)    Syncope    UTI (lower urinary tract infection)     Patient  Active Problem List   Diagnosis Date Noted   Spondylolisthesis of lumbar region 09/02/2014   Syncope    Mixed hyperlipidemia    HTN (hypertension), benign    Aortic stenosis, moderate 10/26/2012   Near syncope 10/26/2012   Dyspnea 02/22/2011   Murmur 02/22/2011   Fatigue 02/22/2011   HTN (hypertension) 02/22/2011   Hypercholesteremia 02/22/2011    Past Surgical History:  Procedure Laterality Date   BACK SURGERY  2004   CATARACT EXTRACTION     COLONOSCOPY W/ POLYPECTOMY  2016   EYE SURGERY     bilateral detatched retina   FOOT SURGERY     LUMBAR FUSION  2016   THYROID SURGERY       OB History    Gravida  4   Para  4   Term  4   Preterm      AB      Living  4     SAB      TAB      Ectopic      Multiple      Live Births              Family History  Problem Relation Age of Onset   Hypertension Mother    Stroke Mother    Diabetes Father  Hypertension Brother    Rheumatic fever Brother    Diabetes Other    Hypertension Other    Stroke Other    Arthritis Other    Migraines Other     Social History   Tobacco Use   Smoking status: Never Smoker   Smokeless tobacco: Never Used  Substance Use Topics   Alcohol use: No   Drug use: No    Home Medications Prior to Admission medications   Medication Sig Start Date End Date Taking? Authorizing Provider  ALPRAZolam Duanne Moron) 0.5 MG tablet Take 0.25 mg by mouth at bedtime as needed for anxiety or sleep.     [provider]  cholecalciferol (VITAMIN D3) 25 MCG (1000 UT) tablet Take 1,000 Units by mouth daily.    [provider]  clindamycin (CLEOCIN) 300 MG capsule Take 300 mg by mouth 3 (three) times daily. 01/22/18   [provider]  diclofenac (VOLTAREN) 75 MG EC tablet Take 75 mg by mouth 2 (two) times daily. 08/22/17   [provider]  ESTRACE VAGINAL 0.1 MG/GM vaginal cream Apply 1 application topically every other day.  07/29/14    [provider]  fluorouracil (EFUDEX) 5 % cream Apply 1 application topically daily as needed. 06/06/16   [provider]  gabapentin (NEURONTIN) 100 MG capsule Take 100 mg by mouth 3 (three) times daily. 01/18/18   [provider]  HYDROcodone-acetaminophen (NORCO/VICODIN) 5-325 MG tablet Take 1 tablet by mouth every 6 (six) hours as needed. 09/26/14   [provider]  hydrocortisone 2.5 % cream Apply 1 application topically 2 (two) times daily as needed. 01/29/18   [provider]  levothyroxine (SYNTHROID, LEVOTHROID) 88 MCG tablet Take 88 mcg by mouth daily before breakfast.    [provider]  lidocaine (XYLOCAINE) 2 % solution Take 5 mLs by mouth 4 (four) times daily as needed. 07/06/17   [provider]  loratadine (CLARITIN) 10 MG tablet Take 10 mg by mouth daily.     [provider]  methenamine (HIPREX) 1 g tablet Take 1 g by mouth daily. 12/20/17   [provider]  methocarbamol (ROBAXIN) 750 MG tablet Take 750 mg by mouth every 6 (six) hours as needed for muscle spasms.    [provider]  metoprolol succinate (TOPROL-XL) 50 MG 24 hr tablet Take 50 mg by mouth daily. Take with or immediately following a meal.    [provider]  Multiple Vitamins-Minerals (PRESERVISION AREDS) CAPS Take 1 capsule by mouth daily. Take 1 tab twice a day    [provider]  omeprazole (PRILOSEC) 40 MG capsule Take 40 mg by mouth daily. 01/17/18   [provider]  pravastatin (PRAVACHOL) 80 MG tablet Take 80 mg by mouth daily.    [provider]  sertraline (ZOLOFT) 100 MG tablet Take 100 mg by mouth daily. 01/18/18   [provider]  terbinafine (LAMISIL) 1 % cream Apply 1 application topically 2 (two) times daily as needed. 01/31/18 01/31/19  [provider]  vitamin C (ASCORBIC ACID) 500 MG tablet Take 500 mg by mouth daily.    [provider]    Allergies      Lisinopril, Sulfa antibiotics, Ciprofloxacin hcl, Nitrofurantoin, and Hydrochlorothiazide  Review of Systems   Review of Systems  Constitutional: Negative for fever.  HENT: Negative for rhinorrhea and sore throat.   Eyes: Negative for redness.  Respiratory: Negative for cough.   Cardiovascular: Negative for chest pain.  Gastrointestinal: Positive  for constipation, nausea and vomiting. Negative for abdominal pain, blood in stool and diarrhea.  Genitourinary: Negative for dysuria.  Musculoskeletal: Negative for myalgias.  Skin: Negative for rash.  Neurological: Negative for headaches.    Physical Exam Updated Vital Signs BP (!) 161/91 (BP Location: Right Arm)    Pulse 79    Temp 98.3 F (36.8 C) (Oral)    Resp 16    Ht 5\' 3"  (1.6 m)    Wt 65.3 kg    BMI 25.51 kg/m   Physical Exam Vitals and nursing note reviewed.  Constitutional:      Appearance: She is well-developed.  HENT:     Head: Normocephalic and atraumatic.     Mouth/Throat:     Mouth: Mucous membranes are dry.  Eyes:     General:        Right eye: No discharge.        Left eye: No discharge.     Conjunctiva/sclera: Conjunctivae normal.  Cardiovascular:     Rate and Rhythm: Normal rate and regular rhythm.     Heart sounds: Normal heart sounds.  Pulmonary:     Effort: Pulmonary effort is normal.     Breath sounds: Normal breath sounds.  Abdominal:     Palpations: Abdomen is soft.     Tenderness: There is no abdominal tenderness. There is no guarding or rebound.  Musculoskeletal:     Cervical back: Normal range of motion and neck supple.  Skin:    General: Skin is warm and dry.  Neurological:     Mental Status: She is alert.     ED Results / Procedures / Treatments   Labs (all labs ordered are listed, but only abnormal results are displayed) Labs Reviewed  COMPREHENSIVE METABOLIC PANEL - Abnormal; Notable for the following components:      Result Value   Glucose, Bld 101 (*)    Creatinine, Ser 1.01  (*)    Albumin 3.1 (*)    Total Bilirubin 0.2 (*)    GFR calc non Af Amer 51 (*)    GFR calc Af Amer 60 (*)    All other components within normal limits  CBC - Abnormal; Notable for the following components:   Hemoglobin 10.6 (*)    MCH 25.5 (*)    MCHC 29.4 (*)    RDW 18.7 (*)    All other components within normal limits  URINALYSIS, ROUTINE W REFLEX MICROSCOPIC - Abnormal; Notable for the following components:   APPearance CLOUDY (*)    Hgb urine dipstick SMALL (*)    Nitrite POSITIVE (*)    Leukocytes,Ua LARGE (*)    WBC, UA >50 (*)    Bacteria, UA RARE (*)    All other components within normal limits  URINE CULTURE  LIPASE, BLOOD    EKG None  Radiology CT Head Wo Contrast  Result Date: 12/28/2018 CLINICAL DATA:  Nausea and vomiting for several weeks. Disorientation with lethargy. EXAM: CT HEAD WITHOUT CONTRAST TECHNIQUE: Contiguous axial images were obtained from the base of the skull through the vertex without intravenous contrast. COMPARISON:  None available. FINDINGS: Brain: There is no evidence of acute intracranial hemorrhage, mass lesion, brain edema or extra-axial fluid collection. The ventricles and subarachnoid spaces are appropriately sized for age. There is no CT evidence of acute cortical infarction. There are mild chronic small vessel ischemic changes in the periventricular white matter. Vascular: Mild intracranial vascular calcifications. No hyperdense vessel identified. Skull: Negative for fracture or focal  lesion. Sinuses/Orbits: The visualized paranasal sinuses and mastoid air cells are clear. No orbital abnormalities are seen. Other: None. IMPRESSION: No acute intracranial findings. Mild chronic small vessel ischemic changes in the periventricular white matter. Electronically Signed   By: Richardean Sale M.D.   On: 12/28/2018 18:53   CT ABDOMEN PELVIS W CONTRAST  Result Date: 12/28/2018 CLINICAL DATA:  Vomiting since Thanksgiving EXAM: CT ABDOMEN AND PELVIS  WITH CONTRAST TECHNIQUE: Multidetector CT imaging of the abdomen and pelvis was performed using the standard protocol following bolus administration of intravenous contrast. CONTRAST:  163mL OMNIPAQUE IOHEXOL 300 MG/ML  SOLN COMPARISON:  02/16/2015 FINDINGS: Lower chest: Basilar atelectasis, no consolidation or pleural effusion. Hepatobiliary: Liver without focal, suspicious lesion or acute finding. Portal vein is patent. Gallbladder and biliary tree are normal. Pancreas: Pancreas without signs of focal lesion, no signs of inflammation or ductal dilation. Limited assessment due to motion artifact. Spleen: Spleen is normal. Adrenals/Urinary Tract: Adrenal glands are normal. Kidneys with mild cortical scarring and a moderate-size left renal cysts. No signs of hydronephrosis. Mild hyperenhancement along the urinary bladder, nearly circumferential but favoring the anterior urinary bladder. A clear fat plane between the urinary bladder in the adjacent sigmoid colon. Stomach/Bowel: Signs of colonic diverticulosis and diverticular disease. Stool throughout the colon. Normal appendix in the right lower quadrant. Signs of potential gastric antral thickening versus peristalsis. Small bowel is normal. Vascular/Lymphatic: Calcified atherosclerotic changes throughout the abdominal aorta. No signs of abdominal aortic aneurysm. No signs of retroperitoneal or upper abdominal lymphadenopathy. No signs of pelvic lymphadenopathy. Reproductive: Uterus in situ, no adnexal masses. Other: No signs of free air or focal fluid. Musculoskeletal: No sign of acute bone finding or destructive bone process. Signs of lumbar laminectomy and fusion at L3-4. IMPRESSION: 1. Findings that favors cystitis. Small locule of gas in the urinary bladder may be related to recent catheterization or instrumentation, correlation with urinalysis is suggested. Asymmetry of bladder wall thickening is mild, given eccentric bladder wall thickening consider follow-up  urologic assessment to exclude underlying lesion. 2. Question gastritis or ulcer disease with antral thickening in the stomach. 3. Signs of colonic diverticulosis and diverticular disease. Electronically Signed   By: Zetta Bills M.D.   On: 12/28/2018 18:59    Procedures Procedures (including critical care time)  Medications Ordered in ED Medications  sodium chloride flush (NS) 0.9 % injection 3 mL (3 mLs Intravenous Given 12/28/18 1638)  sodium chloride 0.9 % bolus 500 mL (0 mLs Intravenous Stopped 12/28/18 1732)  ondansetron (ZOFRAN) injection 4 mg (4 mg Intravenous Given 12/28/18 1637)  iohexol (OMNIPAQUE) 300 MG/ML solution 100 mL (100 mLs Intravenous Contrast Given 12/28/18 1823)  cephALEXin (KEFLEX) capsule 500 mg (500 mg Oral Given 12/28/18 1905)    ED Course  I have reviewed the triage vital signs and the nursing notes.  Pertinent labs & imaging results that were available during my care of the patient were reviewed by me and considered in my medical decision making (see chart for details).  Patient seen and examined.  Reviewed lab work to this point.  White blood cell count reassuring.  Minimal worsening of baseline chronic kidney disease.  She has mild anemia which is at her baseline.  Overall her exam is reassuring.  However, given her persistent symptoms that are not improving, we discussed utility of CT imaging to rule out obstruction or infection.  I think this will be important because it could potentially rule out some concerns of her gastroenterologist.  Discussed that if  the imaging is reassuring, that she will likely be able to be discharged.  She states that she would prefer not to stay in the hospital when she absolutely needs to.  We will treat with fluid bolus and Zofran while awaiting for imaging.  Vital signs reviewed and are as follows: BP (!) 161/91 (BP Location: Right Arm)    Pulse 79    Temp 98.3 F (36.8 C) (Oral)    Resp 16    Ht 5\' 3"  (1.6 m)    Wt 65.3 kg     BMI 25.51 kg/m   7:18 PM patient discussed with and seen earlier by Dr. Sedonia Small.  Work-up significant for findings of UTI on both UA and CT.  Doubt pyelonephritis.  Patient started on Keflex, first dose given in the ED.  In addition, she has some suggestion of gastritis or peptic ulcer disease on the CT.  No perforation.  Patient is already on omeprazole.  Will add Pepcid and Carafate for symptom control.  I encouraged patient to use Tylenol for her chronic back pain instead of diclofenac and to temporarily discontinue diclofenac.  Discussed findings with patient and daughter at bedside.  They are thankful to be able to go home.  Questions answered.  The patient was urged to return to the Emergency Department immediately with worsening of current symptoms, worsening abdominal pain, persistent vomiting, blood noted in stools, fever, or any other concerns. The patient verbalized understanding.      MDM Rules/Calculators/A&P                       Final Clinical Impression(s) / ED Diagnoses Final diagnoses:  Non-intractable vomiting with nausea, unspecified vomiting type  Acute cystitis without hematuria  Acute gastritis without hemorrhage, unspecified gastritis type   Patient with prolonged course of vomiting over the past 3 to 4 weeks.  No significant abdominal pain or distention. Vitals are stable, no fever. Labs are overall reassuring with a normal white blood cell count, chronic anemia unchanged.  UA suggestive of UTI with positive nitrite and greater than 50 white cells with clumping.  Imaging shows negative head CT, possible findings of gastritis/PUD and UTI. No signs of dehydration, patient is tolerating PO's. Lungs are clear and no signs suggestive of PNA. Low concern for appendicitis, cholecystitis, pancreatitis, ruptured viscus, pyelonephritis, kidney stone, aortic dissection, aortic aneurysm or other emergent abdominal etiology. Supportive therapy indicated with return if symptoms  worsen.    Rx / DC Orders ED Discharge Orders         Ordered    cephALEXin (KEFLEX) 500 MG capsule  2 times daily     12/28/18 1913    famotidine (PEPCID) 20 MG tablet  2 times daily     12/28/18 1913    sucralfate (CARAFATE) 1 GM/10ML suspension  3 times daily with meals & bedtime     12/28/18 1913           Carlisle Cater, Hershal Coria 12/28/18 1921    Maudie Flakes, MD 01/01/19 [phone]

## 2018-12-31 LAB — URINE CULTURE: Culture: >=100000 — AB

## 2019-01-01 ENCOUNTER — Telehealth: Payer: Self-pay

## 2019-01-01 NOTE — Telephone Encounter (Signed)
Post ED Visit - Positive Culture Follow-up  Culture report reviewed by antimicrobial stewardship pharmacist: [place] Team []  Elenor Quinones, Pharm.D. []  Heide Guile, Pharm.D., BCPS AQ-ID []  Parks Neptune, Pharm.D., BCPS []  Alycia Rossetti, Pharm.D., BCPS []  [place], Pharm.D., BCPS, AAHIVP []  Legrand Como, Pharm.D., BCPS, AAHIVP []  Salome Arnt, PharmD, BCPS []  Johnnette Gourd, PharmD, BCPS []  Hughes Better, PharmD, BCPS []  Leeroy Cha, PharmD [x]  Laqueta Linden, PharmD, BCPS []  Albertina Parr, PharmD  [place] Team []  Leodis Sias, PharmD []  Lindell Spar, PharmD []  Royetta Asal, PharmD []  Graylin Shiver, Rph []  Rema Fendt) Glennon Mac, PharmD []  Arlyn Dunning, PharmD []  Netta Cedars, PharmD []  Dia Sitter, PharmD []  Leone Haven, PharmD []  Gretta Arab, PharmD []  Theodis Shove, PharmD []  Peggyann Juba, PharmD []  Reuel Boom, PharmD   Positive urine culture Treated with Cephalexin, organism sensitive to the same and no further patient follow-up is required at this time.  Genia Del 01/01/2019, 11:46 AM

## 2019-02-20 ENCOUNTER — Encounter (INDEPENDENT_AMBULATORY_CARE_PROVIDER_SITE_OTHER): Payer: Self-pay

## 2019-02-20 ENCOUNTER — Encounter: Payer: Self-pay | Admitting: Physician Assistant

## 2019-02-20 ENCOUNTER — Ambulatory Visit (INDEPENDENT_AMBULATORY_CARE_PROVIDER_SITE_OTHER): Payer: Medicare Other | Admitting: Physician Assistant

## 2019-02-20 ENCOUNTER — Other Ambulatory Visit: Payer: Self-pay

## 2019-02-20 VITALS — BP 126/78 | HR 73 | Temp 96.7°F | Ht 63.0 in | Wt 134.4 lb

## 2019-02-20 DIAGNOSIS — I350 Nonrheumatic aortic (valve) stenosis: Secondary | ICD-10-CM

## 2019-02-20 DIAGNOSIS — E039 Hypothyroidism, unspecified: Secondary | ICD-10-CM | POA: Diagnosis not present

## 2019-02-20 DIAGNOSIS — I10 Essential (primary) hypertension: Secondary | ICD-10-CM | POA: Diagnosis not present

## 2019-02-20 DIAGNOSIS — R42 Dizziness and giddiness: Secondary | ICD-10-CM | POA: Diagnosis not present

## 2019-02-20 DIAGNOSIS — E785 Hyperlipidemia, unspecified: Secondary | ICD-10-CM

## 2019-02-20 NOTE — Patient Instructions (Signed)
Medication Instructions:  Your physician recommends that you continue on your current medications as directed. Please refer to the Current Medication list given to you today.  *If you need a refill on your cardiac medications before your next appointment, please call your pharmacy*  Lab Work: NONE ordered at this time of appointment   If you have labs (blood work) drawn today and your tests are completely normal, you will receive your results only by: Marland Kitchen MyChart Message (if you have MyChart) OR . A paper copy in the mail If you have any lab test that is abnormal or we need to change your treatment, we will call you to review the results.  Testing/Procedures: Your physician has requested that you have an echocardiogram. Echocardiography is a painless test that uses sound waves to create images of your heart. It provides your doctor with information about the size and shape of your heart and how well your heart's chambers and valves are working. This procedure takes approximately one hour. There are no restrictions for this procedure.   Please schedule for 2-3 weeks  Follow-Up: At [facility], you and your health needs are our priority.  As part of our continuing mission to provide you with exceptional heart care, we have created designated Provider Care Teams.  These Care Teams include your primary Cardiologist (physician) and Advanced Practice Providers (APPs -  Physician Assistants and Nurse Practitioners) who all work together to provide you with the care you need, when you need it.  Your next appointment:   12 month(s)  The format for your next appointment:   In Person  Provider:   Peter Martinique, MD  Other Instructions

## 2019-02-20 NOTE — Progress Notes (Signed)
Cardiology Office Note:    Date:  02/22/2019   ID:  Aimee Ward, DOB 1933/10/09, MRN [id number]  PCP:  Blair Heys, PA-C  Cardiologist:  Peter Martinique, MD  Electrophysiologist:  None   Referring MD: Blair Heys, PA-C   Chief Complaint  Patient presents with  . Follow-up    seen for Dr. Martinique.    History of Present Illness:    Aimee Ward is a 86 y.o. female with a hx of HTN, HLD, moderate AS, hypothyroidism and history of near syncope.  Event monitor showed a few PACs and PVCs however no significant ventricular ectopy.  Previous evaluation for presyncope also noted significant orthostatic hypotension that improved with stopping of amlodipine.  Her last follow-up with Dr. Martinique was in February 2020 at which time she was doing well.  Patient was admitted at [facility] in January after 8 weeks of progressive nausea and vomiting due to gastric outlet obstruction.  At the time, she suffered malnutrition and dehydration from gastric outlet obstruction.  During the hospitalization, she was also noted to be Covid positive.  She had a very mild course of Covid infection and did not require any treatment other than anticoagulation using prophylactic subcu Lovenox.  After 10 days of isolation, she was taken by GI for endoscopy and underwent dilatation of the heart pylorus.  Her pylorus stricture was felt to be related to NSAID use.  Hospital course was complicated by urinary tract infection as well and she went home on 1 week of Augmentin.  After discharge, she underwent a second dilatation procedure by GI for pyloric stenosis.  Note, prior to discharge, she spiked a fever and that this was attributed to urinary tract infection.  However blood culture later returned after her discharge revealing yeast infection in both cultures.  She has since been seen by infectious disease specialist from [place] and initiated fluconazole therapy for Candida glabrata.  Patient presents today for  cardiology office visit.  She continued to have some nausea and a stomach issue.  She says her pyloric stricture returned after the initial dilatation procedure.  She still have some degree of weakness, but denies any exertional chest pain or worsening shortness of breath.  She denies any kind of dizziness, blurred vision or feeling of passing out with exertion.  She does have mild orthostatic hypotension symptom especially in the morning when she tried to get up too quickly.  Overall her blood pressure and heart rate is very well controlled on current therapy.  Last lipid panel obtained by PCP at [facility] in October 2020 showed very well-controlled cholesterol on the Pravachol.  I recommended continue on the current metoprolol and Pravachol dosage.  If she does require a pylorus stricture surgery in the future, I think she is at reasonable risk to proceed.  However I do like to have a repeat echocardiogram prior to the procedure to make sure she does not have severe aortic stenosis and follow-up on the aortic valve gradient.   Past Medical History:  Diagnosis Date  . Anemia   . Anxiety   . Aortic stenosis, moderate   . CHF (congestive heart failure) ([place]) 2006  . DDD (degenerative disc disease), lumbar   . Full dentures    upper  . GERD (gastroesophageal reflux disease)   . Hearing loss of both ears    wears hearing aides  . Hepatitis 1964  . HTN (hypertension), benign    sees Dr Peter Martinique annually to check heart valve  .  Hypothyroidism   . Macular degeneration, bilateral   . Migraine   . Mixed hyperlipidemia   . Murmur   . Osteoarthritis   . PNA (pneumonia) 2006  . PONV (postoperative nausea and vomiting)   . Syncope   . UTI (lower urinary tract infection)     Past Surgical History:  Procedure Laterality Date  . BACK SURGERY  2004  . CATARACT EXTRACTION    . COLONOSCOPY W/ POLYPECTOMY  2016  . EYE SURGERY     bilateral detatched retina  . FOOT SURGERY    . LUMBAR FUSION  2016   . THYROID SURGERY      Current Medications: Current Meds  Medication Sig  . ALPRAZolam (XANAX) 0.5 MG tablet Take 0.25 mg by mouth at bedtime as needed for anxiety or sleep.   Marland Kitchen ESTRACE VAGINAL 0.1 MG/GM vaginal cream Apply 1 application topically every other day.   . fluconazole (DIFLUCAN) 200 MG tablet Take by mouth.  . gabapentin (NEURONTIN) 100 MG capsule Take 100 mg by mouth 3 (three) times daily.  Marland Kitchen HYDROcodone-acetaminophen (NORCO/VICODIN) 5-325 MG tablet Take 1 tablet by mouth every 6 (six) hours as needed.  . hydrocortisone 2.5 % cream Apply 1 application topically 2 (two) times daily as needed.  Marland Kitchen levothyroxine (SYNTHROID, LEVOTHROID) 88 MCG tablet Take 88 mcg by mouth daily before breakfast.  . lidocaine (XYLOCAINE) 2 % solution Take 5 mLs by mouth 4 (four) times daily as needed.  . methocarbamol (ROBAXIN) 750 MG tablet Take 750 mg by mouth every 6 (six) hours as needed for muscle spasms.  . metoprolol succinate (TOPROL-XL) 50 MG 24 hr tablet Take 50 mg by mouth daily. Take with or immediately following a meal.  . pantoprazole (PROTONIX) 40 MG tablet Take by mouth.  . pravastatin (PRAVACHOL) 80 MG tablet Take 80 mg by mouth daily.  . promethazine (PHENERGAN) 25 MG suppository Place rectally.  . sertraline (ZOLOFT) 100 MG tablet Take 100 mg by mouth daily.     Allergies:   Lisinopril, Sulfa antibiotics, Ciprofloxacin hcl, Nitrofurantoin, and Hydrochlorothiazide   Social History   Socioeconomic History  . Marital status: Widowed    Spouse name: Not on file  . Number of children: 4  . Years of education: Not on file  . Highest education level: Not on file  Occupational History  . Not on file  Tobacco Use  . Smoking status: Never Smoker  . Smokeless tobacco: Never Used  Substance and Sexual Activity  . Alcohol use: No  . Drug use: No  . Sexual activity: Never  Other Topics Concern  . Not on file  Social History Narrative  . Not on file   Social Determinants of  Health   Financial Resource Strain:   . Difficulty of Paying Living Expenses: Not on file  Food Insecurity:   . Worried About Charity fundraiser in the Last Year: Not on file  . Ran Out of Food in the Last Year: Not on file  Transportation Needs:   . Lack of Transportation (Medical): Not on file  . Lack of Transportation (Non-Medical): Not on file  Physical Activity:   . Days of Exercise per Week: Not on file  . Minutes of Exercise per Session: Not on file  Stress:   . Feeling of Stress : Not on file  Social Connections:   . Frequency of Communication with Friends and Family: Not on file  . Frequency of Social Gatherings with Friends and Family: Not on file  .  Attends Religious Services: Not on file  . Active Member of Clubs or Organizations: Not on file  . Attends Archivist Meetings: Not on file  . Marital Status: Not on file     Family History: The patient's family history includes Arthritis in an other family member; Diabetes in her father and another family member; Hypertension in her brother, mother, and another family member; Migraines in an other family member; Rheumatic fever in her brother; Stroke in her mother and another family member.  ROS:   Please see the history of present illness.     All other systems reviewed and are negative.  EKGs/Labs/Other Studies Reviewed:    The following studies were reviewed today:  Echo 11/08/2017 LV EF: 60% -  65%   -------------------------------------------------------------------  Indications:   Aortic Valve Disorder (I35.0).   -------------------------------------------------------------------  History:  PMH: HLD. Syncope. Congestive heart failure. Risk  factors: Hypertension.   -------------------------------------------------------------------  Study Conclusions   - Left ventricle: The cavity size was normal. Wall thickness was  increased in a pattern of moderate LVH. Systolic function was    normal. The estimated ejection fraction was in the range of 60%  to 65%. Doppler parameters are consistent with elevated  ventricular end-diastolic filling pressure.  - Aortic valve: There was moderate stenosis. There was mild  regurgitation. Valve area (VTI): 0.89 cm^2. Valve area (Vmax):  0.95 cm^2. Valve area (Vmean): 1 cm^2.  - Aorta: Shadowing artifact seen on root  - Mitral valve: Calcified annulus.  - Atrial septum: No defect or patent foramen ovale was identified  EKG:  EKG is ordered today.  The ekg ordered today demonstrates normal sinus rhythm without significant ST-T wave changes  Recent Labs: 12/28/2018: ALT 11; BUN 12; Creatinine, Ser 1.01; Hemoglobin 10.6; Platelets 353; Potassium 4.5; Sodium 136  Recent Lipid Panel No results found for: CHOL, TRIG, HDL, CHOLHDL, VLDL, LDLCALC, LDLDIRECT  Physical Exam:    VS:  BP 126/78   Pulse 73   Temp (!) 96.7 F (35.9 C)   Ht 5\' 3"  (1.6 m)   Wt 134 lb 6.4 oz (61 kg)   SpO2 96%   BMI 23.81 kg/m     Wt Readings from Last 3 Encounters:  02/20/19 134 lb 6.4 oz (61 kg)  12/28/18 144 lb (65.3 kg)  02/12/18 146 lb (66.2 kg)     GEN:  Well nourished, well developed in no acute distress HEENT: Normal NECK: No JVD; No carotid bruits LYMPHATICS: No lymphadenopathy CARDIAC: RRR, no murmurs, rubs, gallops RESPIRATORY:  Clear to auscultation without rales, wheezing or rhonchi  ABDOMEN: Soft, non-tender, non-distended MUSCULOSKELETAL:  No edema; No deformity  SKIN: Warm and dry NEUROLOGIC:  Alert and oriented x 3 PSYCHIATRIC:  Normal affect   ASSESSMENT:    1. Dizziness   2. Aortic valve stenosis, etiology of cardiac valve disease unspecified   3. Hypothyroidism, unspecified type   4. Essential hypertension   5. Hyperlipidemia LDL goal <100    PLAN:    In order of problems listed above:  1. Dizziness: Associated with orthostatic hypotension, symptoms significantly improved after discontinuing amlodipine    2. Aortic stenosis: Recommend repeat echocardiogram.  She does not have any symptom associated with severe aortic stenosis, therefore I do not think echocardiogram is urgent  3. Hypertension: Blood pressure stable  4. Hyperlipidemia: Continue Pravachol  5. Hypothyroidism: Managed by primary care provider   Medication Adjustments/Labs and Tests Ordered: Current medicines are reviewed at length with the patient today.  Concerns regarding medicines are outlined above.  Orders Placed This Encounter  Procedures  . EKG 12-Lead  . ECHOCARDIOGRAM COMPLETE   No orders of the defined types were placed in this encounter.   Patient Instructions  Medication Instructions:  Your physician recommends that you continue on your current medications as directed. Please refer to the Current Medication list given to you today.  *If you need a refill on your cardiac medications before your next appointment, please call your pharmacy*  Lab Work: NONE ordered at this time of appointment   If you have labs (blood work) drawn today and your tests are completely normal, you will receive your results only by: Marland Kitchen MyChart Message (if you have MyChart) OR . A paper copy in the mail If you have any lab test that is abnormal or we need to change your treatment, we will call you to review the results.  Testing/Procedures: Your physician has requested that you have an echocardiogram. Echocardiography is a painless test that uses sound waves to create images of your heart. It provides your doctor with information about the size and shape of your heart and how well your heart's chambers and valves are working. This procedure takes approximately one hour. There are no restrictions for this procedure.   Please schedule for 2-3 weeks  Follow-Up: At [facility], you and your health needs are our priority.  As part of our continuing mission to provide you with exceptional heart care, we have created designated  Provider Care Teams.  These Care Teams include your primary Cardiologist (physician) and Advanced Practice Providers (APPs -  Physician Assistants and Nurse Practitioners) who all work together to provide you with the care you need, when you need it.  Your next appointment:   12 month(s)  The format for your next appointment:   In Person  Provider:   Peter Martinique, MD  Other Instructions      Signed, Almyra Deforest, [place]  02/22/2019 11:36 PM    [place]

## 2019-02-22 ENCOUNTER — Encounter: Payer: Self-pay | Admitting: Physician Assistant

## 2019-03-04 ENCOUNTER — Other Ambulatory Visit: Payer: Self-pay

## 2019-03-04 ENCOUNTER — Ambulatory Visit (HOSPITAL_COMMUNITY): Payer: Medicare Other | Attending: Cardiovascular Disease

## 2019-03-04 DIAGNOSIS — I350 Nonrheumatic aortic (valve) stenosis: Secondary | ICD-10-CM | POA: Diagnosis present

## 2019-03-04 DIAGNOSIS — I509 Heart failure, unspecified: Secondary | ICD-10-CM | POA: Diagnosis not present

## 2019-03-04 DIAGNOSIS — E785 Hyperlipidemia, unspecified: Secondary | ICD-10-CM | POA: Diagnosis not present

## 2019-03-04 DIAGNOSIS — I352 Nonrheumatic aortic (valve) stenosis with insufficiency: Secondary | ICD-10-CM | POA: Diagnosis not present

## 2019-03-04 DIAGNOSIS — I110 Hypertensive heart disease with heart failure: Secondary | ICD-10-CM | POA: Diagnosis not present

## 2019-03-06 NOTE — Progress Notes (Signed)
Dr. Martinique, please review. Severe LVH noted, hyperdynamic LV function with stable moderate aortic stenosis. BP stable on recent visit in the 120s. No symptom associated with valve issue

## 2019-03-06 NOTE — Progress Notes (Signed)
Reviewed with Dr. Martinique, still moderate aortic stenosis has not changed. Continue observation

## 2020-03-02 ENCOUNTER — Encounter (INDEPENDENT_AMBULATORY_CARE_PROVIDER_SITE_OTHER): Payer: Self-pay | Admitting: Ophthalmology

## 2020-03-02 ENCOUNTER — Ambulatory Visit (INDEPENDENT_AMBULATORY_CARE_PROVIDER_SITE_OTHER): Payer: Medicare Other | Admitting: Ophthalmology

## 2020-03-02 ENCOUNTER — Other Ambulatory Visit: Payer: Self-pay

## 2020-03-02 DIAGNOSIS — H35032 Hypertensive retinopathy, left eye: Secondary | ICD-10-CM | POA: Insufficient documentation

## 2020-03-02 DIAGNOSIS — H4020X0 Unspecified primary angle-closure glaucoma, stage unspecified: Secondary | ICD-10-CM | POA: Diagnosis not present

## 2020-03-02 DIAGNOSIS — H21522 Goniosynechiae, left eye: Secondary | ICD-10-CM | POA: Insufficient documentation

## 2020-03-02 DIAGNOSIS — H353134 Nonexudative age-related macular degeneration, bilateral, advanced atrophic with subfoveal involvement: Secondary | ICD-10-CM | POA: Diagnosis not present

## 2020-03-02 DIAGNOSIS — T8522XA Displacement of intraocular lens, initial encounter: Secondary | ICD-10-CM

## 2020-03-02 MED ORDER — PREDNISOLONE ACETATE 1 % OP SUSP: 1.0000 [drp] | Freq: Four times a day (QID) | OPHTHALMIC | 0 refills | Status: DC

## 2020-03-02 NOTE — Assessment & Plan Note (Signed)
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

## 2020-03-02 NOTE — Assessment & Plan Note (Addendum)
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  Patient and family understand almost no chance for visual acuity improvement nonetheless globe stabilization

## 2020-03-02 NOTE — Progress Notes (Signed)
03/02/2020     CHIEF COMPLAINT Patient presents for Retina Follow Up (1 YR FU OU, ///Pt reports cloudy vision OD, vision loss OS. Some flashes OD, and pain OS. Pt denies any floaters OU. )   HISTORY OF PRESENT ILLNESS: Aimee Ward is a 87 y.o. female who presents to the clinic today for:   HPI    Retina Follow Up    Patient presents with  Wet AMD.  In both eyes.  This started 1 year ago.  Duration of 1 year.  Since onset it is gradually worsening. Additional comments: 1 YR FU OU,    Pt reports cloudy vision OD, vision loss OS. Some flashes OD, and pain OS. Pt denies any floaters OU.           Comments    Patient noticed in October 2021 that the vision left eye had completely left her. She Thought  it was natural.  Gradual onset,       Last edited by Hurman Horn, MD on 03/02/2020 10:59 AM. (History)      Referring physician: Doyle Askew, PA-C [address] [place],  [place] [address]  HISTORICAL INFORMATION:   Selected notes from the MEDICAL RECORD NUMBER       CURRENT MEDICATIONS: Current Outpatient Medications (Ophthalmic Drugs)  Medication Sig  . prednisoLONE acetate (PRED FORTE) 1 % ophthalmic suspension Place 1 drop into the left eye 4 (four) times daily.   No current facility-administered medications for this visit. (Ophthalmic Drugs)   Current Outpatient Medications (Other)  Medication Sig  . ALPRAZolam (XANAX) 0.5 MG tablet Take 0.25 mg by mouth at bedtime as needed for anxiety or sleep.   . cholecalciferol (VITAMIN D3) 25 MCG (1000 UT) tablet Take 1,000 Units by mouth daily.  Marland Kitchen ESTRACE VAGINAL 0.1 MG/GM vaginal cream Apply 1 application topically every other day.   . fluconazole (DIFLUCAN) 200 MG tablet Take by mouth.  . gabapentin (NEURONTIN) 100 MG capsule Take 100 mg by mouth 3 (three) times daily.  Marland Kitchen HYDROcodone-acetaminophen (NORCO/VICODIN) 5-325 MG tablet Take 1 tablet by mouth every 6 (six) hours as needed.  . hydrocortisone 2.5 %  cream Apply 1 application topically 2 (two) times daily as needed.  Marland Kitchen levothyroxine (SYNTHROID, LEVOTHROID) 88 MCG tablet Take 88 mcg by mouth daily before breakfast.  . lidocaine (XYLOCAINE) 2 % solution Take 5 mLs by mouth 4 (four) times daily as needed.  . loratadine (CLARITIN) 10 MG tablet Take 10 mg by mouth daily.   . methocarbamol (ROBAXIN) 750 MG tablet Take 750 mg by mouth every 6 (six) hours as needed for muscle spasms.  . metoprolol succinate (TOPROL-XL) 50 MG 24 hr tablet Take 50 mg by mouth daily. Take with or immediately following a meal.  . Multiple Vitamins-Minerals (PRESERVISION AREDS) CAPS Take 1 capsule by mouth daily. Take 1 tab twice a day  . pantoprazole (PROTONIX) 40 MG tablet Take by mouth.  . pravastatin (PRAVACHOL) 80 MG tablet Take 80 mg by mouth daily.  . promethazine (PHENERGAN) 25 MG suppository Place rectally.  . sertraline (ZOLOFT) 100 MG tablet Take 100 mg by mouth daily.  . vitamin C (ASCORBIC ACID) 500 MG tablet Take 500 mg by mouth daily.   No current facility-administered medications for this visit. (Other)      REVIEW OF SYSTEMS:    ALLERGIES Allergies  Allergen Reactions  . Lisinopril Itching    itching Other  . Sulfa Antibiotics Anaphylaxis  . Ciprofloxacin  Hcl     Other reaction(s): Other Muscle pain  . Nitrofurantoin Itching    Other Other  . Hydrochlorothiazide Other (See Comments)    Cramps  Cramps Cramps Other    PAST MEDICAL HISTORY Past Medical History:  Diagnosis Date  . Anemia   . Anxiety   . Aortic stenosis, moderate   . CHF (congestive heart failure) ([facility]) 2006  . DDD (degenerative disc disease), lumbar   . Full dentures    upper  . GERD (gastroesophageal reflux disease)   . Hearing loss of both ears    wears hearing aides  . Hepatitis 1964  . HTN (hypertension), benign    sees Dr Peter [place] annually to check heart valve  . Hypothyroidism   . Macular degeneration, bilateral   . Migraine   . Mixed  hyperlipidemia   . Murmur   . Osteoarthritis   . PNA (pneumonia) 2006  . PONV (postoperative nausea and vomiting)   . Syncope   . UTI (lower urinary tract infection)    Past Surgical History:  Procedure Laterality Date  . BACK SURGERY  2004  . CATARACT EXTRACTION    . COLONOSCOPY W/ POLYPECTOMY  2016  . EYE SURGERY     bilateral detatched retina  . FOOT SURGERY    . LUMBAR FUSION  2016  . THYROID SURGERY      FAMILY HISTORY Family History  Problem Relation Age of Onset  . Hypertension Mother   . Stroke Mother   . Diabetes Father   . Hypertension Brother   . Rheumatic fever Brother   . Diabetes Other   . Hypertension Other   . Stroke Other   . Arthritis Other   . Migraines Other     SOCIAL HISTORY Social History   Tobacco Use  . Smoking status: Never Smoker  . Smokeless tobacco: Never Used  Vaping Use  . Vaping Use: Never used  Substance Use Topics  . Alcohol use: No  . Drug use: No         OPHTHALMIC EXAM:  Base Eye Exam    Visual Acuity (ETDRS)      Right Left   Dist [place] CF at 1' NLP   Dist ph [place] NI NI       Tonometry (Tonopen, 10:35 AM)      Right Left   Pressure 17 50       Pupils      Dark Light Shape React APD   Right 5 5 Irregular Slow None   Left 5 5 Irregular Slow None       Visual Fields (Counting fingers)      Left Right   Restrictions Total superior temporal, inferior temporal, superior nasal, inferior nasal deficiencies Partial outer superior temporal, inferior temporal, superior nasal, inferior nasal deficiencies       Extraocular Movement      Right Left    Full Full       Neuro/Psych    Oriented x3: Yes        Slit Lamp and Fundus Exam    External Exam      Right Left   External Normal Normal       Slit Lamp Exam      Right Left   Lids/Lashes Normal Normal   Conjunctiva/Sclera White and quiet White and quiet   Cornea Clear Clear   Anterior Chamber Deep and quiet Shallow, Narrow angle, iris bombe   Iris  Round and reactive Iris bombe, ,  Posterior synechiae   Lens Centered posterior chamber intraocular lens Dislocated superior edge anterior to iris, Posterior chamber intraocular lens   Anterior Vitreous Normal Normal       Fundus Exam      Right Left   Posterior Vitreous  Clear view   Disc Normal 4+ Pallor   C/D Ratio 0.45 1.0   Macula Intermediate age related macular degeneration Intermediate age related macular degeneration, Macular atrophy   Vessels Normal Thin attenuated   Periphery Normal Normal          IMAGING AND PROCEDURES  Imaging and Procedures for 03/02/20  OCT, Retina - OU - Both Eyes       Right Eye Quality was poor. Scan locations included subfoveal. Progression has been stable. Findings include abnormal foveal contour, outer retinal atrophy, central retinal atrophy.   Left Eye Quality was poor. Scan locations included subfoveal. Progression has been stable. Findings include abnormal foveal contour, outer retinal atrophy, central retinal atrophy, inner retinal atrophy.        Color Fundus Photography Optos - OU - Both Eyes       Right Eye Progression has been stable. Disc findings include normal observations. Macula : geographic atrophy, drusen. Vessels : normal observations.   Left Eye Progression has worsened. Disc findings include increased cup to disc ratio, pallor. Macula : geographic atrophy, drusen.   Notes OD, geographic atrophy adjacent and around the macula right eye with no signs of CNVM  OS with geographic atrophy in the macular region.  History of scleral buckle, good chorioretinal scarring peripherally with optic nerve totally cupped and pale       Argon Iridectomy - OS - Left Eye       Anesthesia Topical anesthesia was used. Anesthesia medications included Proparacaine, Alphagan 0.15%.   Laser Information The type of laser was yag. Total spots was 26. The energy was 2 mj. Total energy was 56 mj.   Post-op The patient tolerated  the procedure well. There were no complications. The patient received written and verbal post procedure care education.   Notes Narrow angle glaucoma and iris Bombe improved and alleviated in each of these following locations, initial PI placed at 6:00 meridian, chamber deep and in this area, high PAS remained, this was followed by PI at 7, then 5 then at 830 then at 330 and then at 2 meridians in each of these areas deepening only after the PI placed thus loculated posterior synechiae suggesting needing multiple PI                ASSESSMENT/PLAN:  Blind hypertensive left eye Secondary to narrowing glaucoma, neglected, new with I respond to a peripheral anterior synechiae developing secondary likely posterior synechia and possible IOL dislocation anterior partial with iris capture  Narrow angle glaucoma of left eye With blind eye yet patient needs urgent YAG laser peripheral iridotomy for comfort, longstanding control of intraocular pressure in hopes of retaining and maintaining the globe without significant hypertension developing into blind painful eye.  Patient and family understand almost no chance for visual acuity improvement nonetheless globe stabilization  Dislocated IOL (intraocular lens), posterior, left History of synechia, with iris capture of IOL and possible pupillary block leading to iris Pandey  Peripheral anterior synechiae of iris, left Partially improved in each of the locales of the YAG laser PI developed in performed today without trauma      ICD-10-CM   1. Narrow angle glaucoma of left eye  H40.20X0 Argon Iridectomy -  OS - Left Eye  2. Blind hypertensive left eye  H35.032   3. Dislocated IOL (intraocular lens), posterior, left  T85.22XA   4. Peripheral anterior synechiae of iris, left  H21.522   5. Advanced nonexudative age-related macular degeneration of both eyes with subfoveal involvement  H35.3134 OCT, Retina - OU - Both Eyes    Color Fundus Photography  Optos - OU - Both Eyes    1.  YAG laser PI delivered today in multifocal locations because of the possibility of loculated posterior chamber fluid and possible malignant glaucoma since there is anterior chamber shallowing however multiple PIs were applied and clear deepening occurred in each of the previously mentioned locales of placement of the PI,  some high PAS remember remain however  2.  Commence with prednisolone acetate today left eye 4 times daily to control inflammation  3.  Ophthalmic Meds Ordered this visit:  Meds ordered this encounter  Medications  . prednisoLONE acetate (PRED FORTE) 1 % ophthalmic suspension    Sig: Place 1 drop into the left eye 4 (four) times daily.    Dispense:  10 mL    Refill:  0       Return in about 1 day (around 03/03/2020) for PM at 2 PM, dilate, OS, COLOR FP, OCT.  There are no Patient Instructions on file for this visit.   Explained the diagnoses, plan, and follow up with the patient and they expressed understanding.  Patient expressed understanding of the importance of proper follow up care.   Clent Demark Quanisha Drewry M.D. Diseases & Surgery of the Retina and Vitreous [facility] [place] 03/02/20     Abbreviations: M myopia (nearsighted); A astigmatism; H hyperopia (farsighted); P presbyopia; Mrx spectacle prescription;  CTL contact lenses; OD right eye; OS left eye; OU both eyes  XT exotropia; ET esotropia; PEK punctate epithelial keratitis; PEE punctate epithelial erosions; DES dry eye syndrome; MGD meibomian gland dysfunction; ATs artificial tears; PFAT's preservative free artificial tears; [place] nuclear sclerotic cataract; PSC posterior subcapsular cataract; ERM epi-retinal membrane; PVD posterior vitreous detachment; RD retinal detachment; DM diabetes mellitus; DR diabetic retinopathy; NPDR non-proliferative diabetic retinopathy; PDR proliferative diabetic retinopathy; CSME clinically significant macular edema; DME diabetic macular  edema; dbh dot blot hemorrhages; CWS cotton wool spot; POAG primary open angle glaucoma; C/D cup-to-disc ratio; HVF humphrey visual field; GVF goldmann visual field; OCT optical coherence tomography; IOP intraocular pressure; BRVO Branch retinal vein occlusion; CRVO central retinal vein occlusion; CRAO central retinal artery occlusion; BRAO branch retinal artery occlusion; RT retinal tear; SB scleral buckle; PPV pars plana vitrectomy; VH Vitreous hemorrhage; PRP panretinal laser photocoagulation; IVK intravitreal kenalog; VMT vitreomacular traction; MH Macular hole;  NVD neovascularization of the disc; NVE neovascularization elsewhere; AREDS age related eye disease study; ARMD age related macular degeneration; POAG primary open angle glaucoma; EBMD epithelial/anterior basement membrane dystrophy; ACIOL anterior chamber intraocular lens; IOL intraocular lens; PCIOL posterior chamber intraocular lens; Phaco/IOL phacoemulsification with intraocular lens placement; [place] photorefractive keratectomy; LASIK laser assisted in situ keratomileusis; HTN hypertension; DM diabetes mellitus; COPD chronic obstructive pulmonary disease

## 2020-03-02 NOTE — Assessment & Plan Note (Signed)
Secondary to narrowing glaucoma, neglected, new with I respond to a peripheral anterior synechiae developing secondary likely posterior synechia and possible IOL dislocation anterior partial with iris capture

## 2020-03-02 NOTE — Assessment & Plan Note (Signed)
Partially improved in each of the locales of the YAG laser PI developed in performed today without trauma

## 2020-03-03 ENCOUNTER — Encounter: Payer: Self-pay | Admitting: Physician Assistant

## 2020-03-03 ENCOUNTER — Encounter (INDEPENDENT_AMBULATORY_CARE_PROVIDER_SITE_OTHER): Payer: Self-pay | Admitting: Ophthalmology

## 2020-03-03 ENCOUNTER — Ambulatory Visit (INDEPENDENT_AMBULATORY_CARE_PROVIDER_SITE_OTHER): Payer: Medicare Other | Admitting: Physician Assistant

## 2020-03-03 ENCOUNTER — Ambulatory Visit (INDEPENDENT_AMBULATORY_CARE_PROVIDER_SITE_OTHER): Payer: Medicare Other | Admitting: Ophthalmology

## 2020-03-03 VITALS — BP 150/70 | HR 73 | Ht 63.0 in | Wt 145.0 lb

## 2020-03-03 DIAGNOSIS — I350 Nonrheumatic aortic (valve) stenosis: Secondary | ICD-10-CM

## 2020-03-03 DIAGNOSIS — E785 Hyperlipidemia, unspecified: Secondary | ICD-10-CM

## 2020-03-03 DIAGNOSIS — H353222 Exudative age-related macular degeneration, left eye, with inactive choroidal neovascularization: Secondary | ICD-10-CM

## 2020-03-03 DIAGNOSIS — H353114 Nonexudative age-related macular degeneration, right eye, advanced atrophic with subfoveal involvement: Secondary | ICD-10-CM | POA: Diagnosis not present

## 2020-03-03 DIAGNOSIS — H4020X0 Unspecified primary angle-closure glaucoma, stage unspecified: Secondary | ICD-10-CM

## 2020-03-03 DIAGNOSIS — I10 Essential (primary) hypertension: Secondary | ICD-10-CM

## 2020-03-03 DIAGNOSIS — H35032 Hypertensive retinopathy, left eye: Secondary | ICD-10-CM

## 2020-03-03 DIAGNOSIS — R002 Palpitations: Secondary | ICD-10-CM

## 2020-03-03 NOTE — Assessment & Plan Note (Signed)

## 2020-03-03 NOTE — Progress Notes (Signed)
03/03/2020     CHIEF COMPLAINT Patient presents for Retina Follow Up (1 Day PO OS///Pt reports pain has gotten better since yesterday. Pt denies any F/F, or pressure in OS. )  Prior to yesterday's visit, the patient had not been seen for 1 year previous.  Upon arrival yesterday she had complained of declining in fact absent vision in the left eye since October.  No Complaints of discomfort.  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  Posterior synechia to the capsule in the optic most likely hampered normal aqueous flow leading to iris bombe.  Multifocal YAG laser peripheral iridectomy's were performed on emergency basis to alleviate the intraocular pressure 50.  The goal is to keep the eye comfortable so as to maintain integrity of the globe nonetheless with  NLP vision HISTORY OF PRESENT ILLNESS: Aimee Ward is a 87 y.o. female who presents to the clinic today for:   HPI    Retina Follow Up    Patient presents with  Other.  In left eye.  This started 1 day ago.  Duration of 1 day.  Since onset it is gradually improving. Additional comments: 1 Day PO OS   Pt reports pain has gotten better since yesterday. Pt denies any F/F, or pressure in OS.        Last edited by Nichola Sizer D on 03/03/2020  1:47 PM. (History)      Referring physician: Jettie Booze, NP [address],  [place] [address]  HISTORICAL INFORMATION:   Selected notes from the MEDICAL RECORD NUMBER       CURRENT MEDICATIONS: Current Outpatient Medications (Ophthalmic Drugs)  Medication Sig  . prednisoLONE acetate (PRED FORTE) 1 % ophthalmic suspension Place 1 drop into the left eye 4 (four) times daily.   No current facility-administered medications for this visit. (Ophthalmic Drugs)   Current Outpatient  Medications (Other)  Medication Sig  . acetic acid-hydrocortisone (VOSOL-HC) OTIC solution PLEASE SEE ATTACHED FOR DETAILED DIRECTIONS  . ALPRAZolam (XANAX) 0.5 MG tablet Take 0.25 mg by mouth at bedtime as needed for anxiety or sleep.   . cholecalciferol (VITAMIN D3) 25 MCG (1000 UT) tablet Take 1,000 Units by mouth daily.  Marland Kitchen ESTRACE VAGINAL 0.1 MG/GM vaginal cream Apply 1 application topically every other day.   . fluconazole (DIFLUCAN) 200 MG tablet Take by mouth.  . gabapentin (NEURONTIN) 100 MG capsule Take 100 mg by mouth 3 (three) times daily.  Marland Kitchen HYDROcodone-acetaminophen (NORCO/VICODIN) 5-325 MG tablet Take 1 tablet by mouth every 6 (six) hours as needed.  . hydrocortisone 2.5 % cream Apply 1 application topically 2 (two) times daily as needed.  Marland Kitchen levothyroxine (SYNTHROID, LEVOTHROID) 88 MCG tablet Take 88 mcg by mouth daily before breakfast.  . lidocaine (XYLOCAINE) 2 % solution Take 5 mLs by mouth 4 (four) times daily as needed.  . loratadine (CLARITIN) 10 MG tablet Take 10 mg by mouth daily.  . methocarbamol (ROBAXIN) 750 MG tablet Take 750 mg by mouth every 6 (six) hours as needed for muscle spasms.  . metoprolol succinate (TOPROL-XL) 50 MG 24 hr tablet Take 50 mg by mouth daily. Take with or immediately following a meal.  . Multiple Vitamins-Minerals (PRESERVISION AREDS) CAPS Take 1 capsule by mouth daily. Take 1 tab twice a day  . pantoprazole (PROTONIX) 40 MG  tablet Take by mouth.  . pravastatin (PRAVACHOL) 80 MG tablet Take 80 mg by mouth daily.  . promethazine (PHENERGAN) 25 MG suppository Place rectally.  . sertraline (ZOLOFT) 100 MG tablet Take 100 mg by mouth daily.  . vitamin C (ASCORBIC ACID) 500 MG tablet Take 500 mg by mouth daily.   No current facility-administered medications for this visit. (Other)      REVIEW OF SYSTEMS:    ALLERGIES Allergies  Allergen Reactions  . Lisinopril Itching    itching Other  . Sulfa Antibiotics Anaphylaxis  . Ciprofloxacin  Hcl     Other reaction(s): Other Muscle pain  . Nitrofurantoin Itching    Other Other  . Hydrochlorothiazide Other (See Comments)    Cramps  Cramps Cramps Other    PAST MEDICAL HISTORY Past Medical History:  Diagnosis Date  . Anemia   . Anxiety   . Aortic stenosis, moderate   . CHF (congestive heart failure) ([place]) 2006  . DDD (degenerative disc disease), lumbar   . Full dentures    upper  . GERD (gastroesophageal reflux disease)   . Hearing loss of both ears    wears hearing aides  . Hepatitis 1964  . HTN (hypertension), benign    sees Dr Peter [place] annually to check heart valve  . Hypothyroidism   . Macular degeneration, bilateral   . Migraine   . Mixed hyperlipidemia   . Murmur   . Osteoarthritis   . PNA (pneumonia) 2006  . PONV (postoperative nausea and vomiting)   . Syncope   . UTI (lower urinary tract infection)    Past Surgical History:  Procedure Laterality Date  . BACK SURGERY  2004  . CATARACT EXTRACTION    . COLONOSCOPY W/ POLYPECTOMY  2016  . EYE SURGERY     bilateral detatched retina  . FOOT SURGERY    . LUMBAR FUSION  2016  . THYROID SURGERY      FAMILY HISTORY Family History  Problem Relation Age of Onset  . Hypertension Mother   . Stroke Mother   . Diabetes Father   . Hypertension Brother   . Rheumatic fever Brother   . Diabetes Other   . Hypertension Other   . Stroke Other   . Arthritis Other   . Migraines Other     SOCIAL HISTORY Social History   Tobacco Use  . Smoking status: Never Smoker  . Smokeless tobacco: Never Used  Vaping Use  . Vaping Use: Never used  Substance Use Topics  . Alcohol use: No  . Drug use: No         OPHTHALMIC EXAM:  Base Eye Exam    Visual Acuity (ETDRS)      Right Left   Dist [place] CF at 1' NLP   Dist ph [place] NI NI       Tonometry (Tonopen, 1:56 PM)      Right Left   Pressure 8 33       Tonometry #2 (Tonopen, 1:56 PM)      Right Left   Pressure  34       Pupils      Pupils  Dark Light Shape React APD   Right PERRL 5 5 Irregular Slow None   Left PERRL 5 5 Irregular Slow None       Visual Fields (Counting fingers)      Left Right   Restrictions Total superior temporal, inferior temporal, superior nasal, inferior nasal deficiencies Partial outer superior temporal, inferior  temporal, superior nasal, inferior nasal deficiencies       Extraocular Movement      Right Left    Full Full       Neuro/Psych    Oriented x3: Yes        Slit Lamp and Fundus Exam    External Exam      Right Left   External Normal Normal       Slit Lamp Exam      Right Left   Lids/Lashes Normal Normal   Conjunctiva/Sclera White and quiet White and quiet   Cornea Clear Clear   Anterior Chamber Deep and quiet Shallow , Narrow angle, iris bombe, improved post yag pi's   Iris Round and reactive Iris bombe less extensive,, , PI's patent at 6, 5 [phone], Posterior synechiae to iol, with iris capture superotemporal portion iol.   Lens Centered posterior chamber intraocular lens Anterior Dislocated superiotemporal edge anterior to iris with pupil block due to Post synechaie to capsule and iol,,,    Anterior Vitreous Normal Normal       Fundus Exam      Right Left   Posterior Vitreous  Clear view   Disc  4+ Pallor   C/D Ratio  1.0   Macula  Intermediate age related macular degeneration, Macular atrophy   Vessels  Thin attenuated   Periphery  Normal          IMAGING AND PROCEDURES  Imaging and Procedures for 03/03/20  Color Fundus Photography Optos - OU - Both Eyes       Right Eye Progression has been stable. Disc findings include normal observations. Macula : geographic atrophy, drusen. Vessels : normal observations.   Left Eye Progression has been stable. Disc findings include increased cup to disc ratio, pallor. Macula : geographic atrophy, drusen.   Notes OD, geographic atrophy adjacent and around the macula right eye with no signs of CNVM  OS with geographic  atrophy in the macular region.  Media clear, with lower IOP history of scleral buckle, good chorioretinal scarring peripherally with optic nerve totally cupped and pale                ASSESSMENT/PLAN:  Narrow angle glaucoma of left eye Secondary narrow-angle glaucoma improved today post multifocal YAG laser peripheral iridectomies for loculated posterior fluid due to posterior synechia of the pupillary margin to the capsule and to the IOL which is inducing I respond a secondary angle glaucoma closure  Ocular pressure has stabilized yet not likely ever to normalize in this left eye due to synechia anterior angle closure from longstanding iris bombe  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 The results of the AREDS 2 study was discussed with the patient. A diet rich in dark leafy green vegetables was advised and specific recommendations were made regarding supplements with AREDS 2 formulation . Control of hypertension and serum cholesterol may slow the disease. Smoking cessation is mandatory to slow the disease and diminish the risk of progressing to wet age related macular degeneration. The patient was instructed in the use of an [place] and was told to return immediately for any changes in the Grid. Stressed to the patient do not rub eyes  Exudative age-related macular degeneration of left eye with inactive choroidal neovascularization ([place]) No signs of disease reactivation, with  NLP vision  due to optic nerve death, will observe      ICD-10-CM   1. Narrow angle glaucoma of left eye  H40.20X0 OCT, Retina - OU - Both Eyes    Color Fundus Photography Optos - OU - Both Eyes  2. Blind hypertensive left eye  H35.032 OCT, Retina - OU - Both Eyes    Color Fundus Photography Optos - OU - Both Eyes  3. Advanced  nonexudative age-related macular degeneration of right eye with subfoveal involvement  H35.3114   4. Exudative age-related macular degeneration of left eye with inactive choroidal neovascularization ([place])  H35.3222     1.  Continue topical Pred forte left eye 4 times daily for 4 days  2.  I will asked the patient to follow-up with Dr. Dennison Mascot, of [facility] for long-term management of the globe left eye.  3.  We will continue to monitor for ARMD progression here  Ophthalmic Meds Ordered this visit:  No orders of the defined types were placed in this encounter.      Return in about 3 months (around 05/31/2020) for dilate, OD, COLOR FP, OCT.  There are no Patient Instructions on file for this visit.   Explained the diagnoses, plan, and follow up with the patient and they expressed understanding.  Patient expressed understanding of the importance of proper follow up care.   Clent Demark Analiyah Lechuga M.D. Diseases & Surgery of the Retina and Vitreous Retina & Diabetic [place] 03/03/20     Abbreviations: M myopia (nearsighted); A astigmatism; H hyperopia (farsighted); P presbyopia; Mrx spectacle prescription;  CTL contact lenses; OD right eye; OS left eye; OU both eyes  XT exotropia; ET esotropia; PEK punctate epithelial keratitis; PEE punctate epithelial erosions; DES dry eye syndrome; MGD meibomian gland dysfunction; ATs artificial tears; PFAT's preservative free artificial tears; [place] nuclear sclerotic cataract; PSC posterior subcapsular cataract; ERM epi-retinal membrane; PVD posterior vitreous detachment; RD retinal detachment; DM diabetes mellitus; DR diabetic retinopathy; NPDR non-proliferative diabetic retinopathy; PDR proliferative diabetic retinopathy; CSME clinically significant macular edema; DME diabetic macular edema; dbh dot blot hemorrhages; CWS cotton wool spot; POAG primary open angle glaucoma; C/D cup-to-disc ratio; HVF humphrey visual field; GVF goldmann visual field;  OCT optical coherence tomography; IOP intraocular pressure; BRVO Branch retinal vein occlusion; CRVO central retinal vein occlusion; CRAO central retinal artery occlusion; BRAO branch retinal artery occlusion; RT retinal tear; SB scleral buckle; PPV pars plana vitrectomy; VH Vitreous hemorrhage; PRP panretinal laser photocoagulation; IVK intravitreal kenalog; VMT vitreomacular traction; MH Macular hole;  NVD neovascularization of the disc; NVE neovascularization elsewhere; AREDS age related eye disease study; ARMD age related macular degeneration; POAG primary open angle glaucoma; EBMD epithelial/anterior basement membrane dystrophy; ACIOL anterior chamber intraocular lens; IOL intraocular lens; PCIOL posterior chamber intraocular lens; Phaco/IOL phacoemulsification with intraocular lens placement; [place] photorefractive keratectomy; LASIK laser assisted in situ keratomileusis; HTN hypertension; DM diabetes mellitus; COPD chronic obstructive pulmonary disease

## 2020-03-03 NOTE — Progress Notes (Signed)
Cardiology Office Note:    Date:  03/04/2020   ID:  Aimee Ward, DOB [birth year], MRN [id number]  PCP:  Jettie Booze, NP   [place]  Cardiologist:  Peter Martinique, MD  Advanced Practice Provider:  No care team member to display Electrophysiologist:  None   Referring MD: Doyle Askew, PA-C   Chief Complaint  Patient presents with  . Follow-up    Seen for Dr. Martinique    History of Present Illness:    Aimee Ward is a 87 y.o. female with a hx of HTN, HLD, moderate AS, hypothyroidism and history of near syncope.  Event monitor showed a few PACs and PVCs however no significant ventricular ectopy.  Previous evaluation for presyncope also noted significant orthostatic hypotension that improved with stopping of amlodipine.  Her last follow-up with Dr. Martinique was in February 2020 at which time she was doing well.  Patient was admitted at [facility] in January after 8 weeks of progressive nausea and vomiting due to gastric outlet obstruction.  At the time, she suffered malnutrition and dehydration from gastric outlet obstruction.  During the hospitalization, she was also noted to be Covid positive.  She had a very mild course of Covid infection and did not require any treatment other than anticoagulation using prophylactic subcu Lovenox.  After 10 days of isolation, she was taken by GI for endoscopy and underwent dilatation of the heart pylorus.  Her pylorus stricture was felt to be related to NSAID use.  Hospital course was complicated by urinary tract infection as well and she went home on 1 week of Augmentin.  After discharge, she underwent a second dilatation procedure by GI for pyloric stenosis.  Note, prior to discharge, she spiked a fever and this was attributed to urinary tract infection.  However blood culture later returned after her discharge revealing yeast infection in both cultures.  She has since been seen by infectious disease specialist  from [place] and initiated fluconazole therapy for Candida glabrata.  I last saw the patient on 02/20/2019 at which time she was still dealing with issues with pyloric stricture.  It appears patient eventually underwent antrectomy and gastrojejunostomy with gastrotomy tube placement by general surgery on 04/01/2019.  Patient was diagnosed with left eye vision loss and closed angle glaucoma yesterday.  Overall, she has been doing okay from cardiac perspective and denies any chest pain with exertion or increased dyspnea on exertion.  She has no lower extremity edema, orthopnea or PND.  Her lungs is clear.  Heart rate is regular.   Past Medical History:  Diagnosis Date  . Anemia   . Anxiety   . Aortic stenosis, moderate   . CHF (congestive heart failure) ([place]) 2006  . DDD (degenerative disc disease), lumbar   . Full dentures    upper  . GERD (gastroesophageal reflux disease)   . Hearing loss of both ears    wears hearing aides  . Hepatitis 1964  . HTN (hypertension), benign    sees Dr Peter Martinique annually to check heart valve  . Hypothyroidism   . Macular degeneration, bilateral   . Migraine   . Mixed hyperlipidemia   . Murmur   . Osteoarthritis   . PNA (pneumonia) 2006  . PONV (postoperative nausea and vomiting)   . Syncope   . UTI (lower urinary tract infection)     Past Surgical History:  Procedure Laterality Date  . BACK SURGERY  2004  . CATARACT  EXTRACTION    . COLONOSCOPY W/ POLYPECTOMY  2016  . EYE SURGERY     bilateral detatched retina  . FOOT SURGERY    . LUMBAR FUSION  2016  . THYROID SURGERY      Current Medications: Current Meds  Medication Sig  . ALPRAZolam (XANAX) 0.5 MG tablet Take 0.25 mg by mouth at bedtime as needed for anxiety or sleep.   . cholecalciferol (VITAMIN D3) 25 MCG (1000 UT) tablet Take 1,000 Units by mouth daily.  Marland Kitchen ESTRACE VAGINAL 0.1 MG/GM vaginal cream Apply 1 application topically every other day.   . fluconazole (DIFLUCAN) 200 MG tablet  Take by mouth.  . gabapentin (NEURONTIN) 100 MG capsule Take 100 mg by mouth 3 (three) times daily.  Marland Kitchen HYDROcodone-acetaminophen (NORCO/VICODIN) 5-325 MG tablet Take 1 tablet by mouth every 6 (six) hours as needed.  . hydrocortisone 2.5 % cream Apply 1 application topically 2 (two) times daily as needed.  Marland Kitchen levothyroxine (SYNTHROID, LEVOTHROID) 88 MCG tablet Take 88 mcg by mouth daily before breakfast.  . lidocaine (XYLOCAINE) 2 % solution Take 5 mLs by mouth 4 (four) times daily as needed.  . loratadine (CLARITIN) 10 MG tablet Take 10 mg by mouth daily.  . methocarbamol (ROBAXIN) 750 MG tablet Take 750 mg by mouth every 6 (six) hours as needed for muscle spasms.  . metoprolol succinate (TOPROL-XL) 50 MG 24 hr tablet Take 50 mg by mouth daily. Take with or immediately following a meal.  . Multiple Vitamins-Minerals (PRESERVISION AREDS) CAPS Take 1 capsule by mouth daily. Take 1 tab twice a day  . pantoprazole (PROTONIX) 40 MG tablet Take by mouth.  . pravastatin (PRAVACHOL) 80 MG tablet Take 80 mg by mouth daily.  . prednisoLONE acetate (PRED FORTE) 1 % ophthalmic suspension Place 1 drop into the left eye 4 (four) times daily.  . promethazine (PHENERGAN) 25 MG suppository Place rectally.  . sertraline (ZOLOFT) 100 MG tablet Take 100 mg by mouth daily.  . vitamin C (ASCORBIC ACID) 500 MG tablet Take 500 mg by mouth daily.     Allergies:   Lisinopril, Sulfa antibiotics, Ciprofloxacin hcl, Nitrofurantoin, and Hydrochlorothiazide   Social History   Socioeconomic History  . Marital status: Widowed    Spouse name: Not on file  . Number of children: 4  . Years of education: Not on file  . Highest education level: Not on file  Occupational History  . Not on file  Tobacco Use  . Smoking status: Never Smoker  . Smokeless tobacco: Never Used  Vaping Use  . Vaping Use: Never used  Substance and Sexual Activity  . Alcohol use: No  . Drug use: No  . Sexual activity: Never  Other Topics  Concern  . Not on file  Social History Narrative  . Not on file   Social Determinants of Health   Financial Resource Strain: Not on file  Food Insecurity: Not on file  Transportation Needs: Not on file  Physical Activity: Not on file  Stress: Not on file  Social Connections: Not on file     Family History: The patient's family history includes Arthritis in an other family member; Diabetes in her father and another family member; Hypertension in her brother, mother, and another family member; Migraines in an other family member; Rheumatic fever in her brother; Stroke in her mother and another family member.  ROS:   Please see the history of present illness.     All other systems reviewed and are  negative.  EKGs/Labs/Other Studies Reviewed:    The following studies were reviewed today:  Echo 03/04/2019 1. Intracavitary gradient noted. Peak velocity 1.68 m/s. Peak gradient  11.3 mmHg. Left ventricular ejection fraction, by estimation, is >75%. The  left ventricle has hyperdynamic function. The left ventricle has no  regional wall motion abnormalities.  There is severe concentric left ventricular hypertrophy. Left ventricular  diastolic parameters are consistent with Grade I diastolic dysfunction  (impaired relaxation). Elevated left ventricular end-diastolic pressure.  2. Right ventricular systolic function is normal. The right ventricular  size is normal. There is normal pulmonary artery systolic pressure.  3. The mitral valve is normal in structure and function. No evidence of  mitral valve regurgitation. No evidence of mitral stenosis.  4. The aortic valve is normal in structure and function. Aortic valve  regurgitation is moderate. Moderate aortic valve stenosis. Aortic  regurgitation PHT measures 469 msec. Aortic valve area, by VTI measures  1.03 cm. Aortic valve mean gradient measures  21.5 mmHg. Aortic valve Vmax measures 3.26 m/s.  5. The inferior vena cava is  normal in size with greater than 50%  respiratory variability, suggesting right atrial pressure of 3 mmHg.   EKG:  EKG is ordered today.  The ekg ordered today demonstrates normal sinus rhythm no significant ST-T wave changes  Recent Labs: No results found for requested labs within last 8760 hours.  Recent Lipid Panel No results found for: CHOL, TRIG, HDL, CHOLHDL, VLDL, LDLCALC, LDLDIRECT   Risk Assessment/Calculations:       Physical Exam:    VS:  BP (!) 150/70   Pulse 73   Ht 5\' 3"  (1.6 m)   Wt 145 lb (65.8 kg)   SpO2 96%   BMI 25.69 kg/m     Wt Readings from Last 3 Encounters:  03/03/20 145 lb (65.8 kg)  02/20/19 134 lb 6.4 oz (61 kg)  12/28/18 144 lb (65.3 kg)     GEN:  Well nourished, well developed in no acute distress HEENT: Normal NECK: No JVD; No carotid bruits LYMPHATICS: No lymphadenopathy CARDIAC: RRR, no rubs, gallops  2/6 murmur RESPIRATORY:  Clear to auscultation without rales, wheezing or rhonchi  ABDOMEN: Soft, non-tender, non-distended MUSCULOSKELETAL:  No edema; No deformity  SKIN: Warm and dry NEUROLOGIC:  Alert and oriented x 3 PSYCHIATRIC:  Normal affect   ASSESSMENT:    1. Palpitations   2. Aortic valve stenosis, etiology of cardiac valve disease unspecified   3. Primary hypertension   4. Hyperlipidemia LDL goal <100    PLAN:    In order of problems listed above:  1. Palpitation: History of PACs and PVCs on the previous heart monitor. Symptom well controlled on metoprolol succinate  2. Aortic stenosis: Seen on previous echocardiogram in February 2021.  Will need repeat echocardiogram prior to the next visit.  She has no worsening exertional symptoms.  3. Hypertension: blood pressure mildly elevated today, however normally her blood pressure is well controlled.  She is having glaucoma of the left eye that is causing her some anxiety issue.  This is being followed by ophthalmology service.  4. Hyperlipidemia: Continue pravastatin         Medication Adjustments/Labs and Tests Ordered: Current medicines are reviewed at length with the patient today.  Concerns regarding medicines are outlined above.  Orders Placed This Encounter  Procedures  . EKG 12-Lead  . ECHOCARDIOGRAM COMPLETE   No orders of the defined types were placed in this encounter.   Patient Instructions  Medication Instructions:  Your physician recommends that you continue on your current medications as directed. Please refer to the Current Medication list given to you today.  *If you need a refill on your cardiac medications before your next appointment, please call your pharmacy*  Lab Work: NONE ordered at this time of appointment   If you have labs (blood work) drawn today and your tests are completely normal, you will receive your results only by: Marland Kitchen MyChart Message (if you have MyChart) OR . A paper copy in the mail If you have any lab test that is abnormal or we need to change your treatment, we will call you to review the results.  Testing/Procedures: Your physician has requested that you have an echocardiogram. Echocardiography is a painless test that uses sound waves to create images of your heart. It provides your doctor with information about the size and shape of your heart and how well your heart's chambers and valves are working. This procedure takes approximately one hour. There are no restrictions for this procedure. This test is done at [address], [place], [place] [address]   Please schedule for Jan/Feb 2023   Follow-Up: At [facility], you and your health needs are our priority.  As part of our continuing mission to provide you with exceptional heart care, we have created designated Provider Care Teams.  These Care Teams include your primary Cardiologist (physician) and Advanced Practice Providers (APPs -  Physician Assistants and Nurse Practitioners) who all work together to provide you with the care you need, when  you need it.  We recommend signing up for the patient portal called "MyChart".  Sign up information is provided on this After Visit Summary.  MyChart is used to connect with patients for Virtual Visits (Telemedicine).  Patients are able to view lab/test results, encounter notes, upcoming appointments, etc.  Non-urgent messages can be sent to your provider as well.   To learn more about what you can do with MyChart, go to [email or web address].    Your next appointment:   1 year(s)  The format for your next appointment:   In Person  Provider:   Peter Martinique, MD  Other Instructions      Signed, Almyra Deforest, [place]  03/04/2020 9:23 PM    [place]

## 2020-03-03 NOTE — Patient Instructions (Addendum)
Medication Instructions:  Your physician recommends that you continue on your current medications as directed. Please refer to the Current Medication list given to you today.  *If you need a refill on your cardiac medications before your next appointment, please call your pharmacy*  Lab Work: NONE ordered at this time of appointment   If you have labs (blood work) drawn today and your tests are completely normal, you will receive your results only by: Marland Kitchen MyChart Message (if you have MyChart) OR . A paper copy in the mail If you have any lab test that is abnormal or we need to change your treatment, we will call you to review the results.  Testing/Procedures: Your physician has requested that you have an echocardiogram. Echocardiography is a painless test that uses sound waves to create images of your heart. It provides your doctor with information about the size and shape of your heart and how well your heart's chambers and valves are working. This procedure takes approximately one hour. There are no restrictions for this procedure. This test is done at [address] [place], [place] [address]   Please schedule for Jan/Feb 2023   Follow-Up: At [facility], you and your health needs are our priority.  As part of our continuing mission to provide you with exceptional heart care, we have created designated Provider Care Teams.  These Care Teams include your primary Cardiologist (physician) and Advanced Practice Providers (APPs -  Physician Assistants and Nurse Practitioners) who all work together to provide you with the care you need, when you need it.  We recommend signing up for the patient portal called "MyChart".  Sign up information is provided on this After Visit Summary.  MyChart is used to connect with patients for Virtual Visits (Telemedicine).  Patients are able to view lab/test results, encounter notes, upcoming appointments, etc.  Non-urgent messages can be sent to your  provider as well.   To learn more about what you can do with MyChart, go to [email or web address].    Your next appointment:   1 year(s)  The format for your next appointment:   In Person  Provider:   Peter Martinique, MD  Other Instructions

## 2020-03-03 NOTE — Assessment & Plan Note (Addendum)
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

## 2020-03-03 NOTE — Assessment & Plan Note (Signed)
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

## 2020-03-04 ENCOUNTER — Encounter: Payer: Self-pay | Admitting: Physician Assistant

## 2020-06-02 ENCOUNTER — Encounter (INDEPENDENT_AMBULATORY_CARE_PROVIDER_SITE_OTHER): Payer: Self-pay | Admitting: Ophthalmology

## 2020-06-02 ENCOUNTER — Other Ambulatory Visit: Payer: Self-pay

## 2020-06-02 ENCOUNTER — Ambulatory Visit (INDEPENDENT_AMBULATORY_CARE_PROVIDER_SITE_OTHER): Payer: Medicare Other | Admitting: Ophthalmology

## 2020-06-02 DIAGNOSIS — H35032 Hypertensive retinopathy, left eye: Secondary | ICD-10-CM

## 2020-06-02 DIAGNOSIS — H353114 Nonexudative age-related macular degeneration, right eye, advanced atrophic with subfoveal involvement: Secondary | ICD-10-CM

## 2020-06-02 DIAGNOSIS — H21522 Goniosynechiae, left eye: Secondary | ICD-10-CM | POA: Diagnosis not present

## 2020-06-02 MED ORDER — ATROPINE SULFATE 1 % OP SOLN: 1.0000 [drp] | Freq: Two times a day (BID) | OPHTHALMIC | 14 refills | Status: DC

## 2020-06-02 NOTE — Assessment & Plan Note (Signed)
Advanced dry atrophic ARMD proxy 12 disc areas in size  accounts for acuity

## 2020-06-02 NOTE — Progress Notes (Signed)
06/02/2020     CHIEF COMPLAINT Patient presents for Retina Follow Up (3 Mo F/U OD//Pt c/o pain on L side of head around eye off and on. Pt sts VA OD is getting dimmer. Pt sts she sees occasional halo of lights OD. No ocular pain OD.)   HISTORY OF PRESENT ILLNESS: Aimee Ward is a 87 y.o. female who presents to the clinic today for:   HPI    Retina Follow Up    Diagnosis: Dry AMD   Laterality: right eye   Onset: 3 months ago   Severity: severe   Duration: 3 months   Course: gradually worsening   Comments: 3 Mo F/U OD  Pt c/o pain on L side of head around eye off and on. Pt sts VA OD is getting dimmer. Pt sts she sees occasional halo of lights OD. No ocular pain OD.       Last edited by Rockie Neighbours, [place] on 06/02/2020  1:39 PM. (History)      Referring physician: Jettie Booze, NP [address],  [place] [address]  HISTORICAL INFORMATION:   Selected notes from the MEDICAL RECORD NUMBER       CURRENT MEDICATIONS: Current Outpatient Medications (Ophthalmic Drugs)  Medication Sig  . atropine 1 % ophthalmic solution Place 1 drop into the left eye 2 (two) times daily.  . prednisoLONE acetate (PRED FORTE) 1 % ophthalmic suspension Place 1 drop into the left eye 4 (four) times daily. (Patient not taking: Reported on 06/02/2020)   No current facility-administered medications for this visit. (Ophthalmic Drugs)   Current Outpatient Medications (Other)  Medication Sig  . acetic acid-hydrocortisone (VOSOL-HC) OTIC solution PLEASE SEE ATTACHED FOR DETAILED DIRECTIONS  . ALPRAZolam (XANAX) 0.5 MG tablet Take 0.25 mg by mouth at bedtime as needed for anxiety or sleep.   . cholecalciferol (VITAMIN D3) 25 MCG (1000 UT) tablet Take 1,000 Units by mouth daily.  Marland Kitchen ESTRACE VAGINAL 0.1 MG/GM vaginal cream Apply 1 application topically every other day.   . fluconazole (DIFLUCAN) 200 MG tablet Take by mouth.  . gabapentin (NEURONTIN) 100 MG capsule Take 100 mg by mouth  3 (three) times daily.  Marland Kitchen HYDROcodone-acetaminophen (NORCO/VICODIN) 5-325 MG tablet Take 1 tablet by mouth every 6 (six) hours as needed.  . hydrocortisone 2.5 % cream Apply 1 application topically 2 (two) times daily as needed.  Marland Kitchen levothyroxine (SYNTHROID, LEVOTHROID) 88 MCG tablet Take 88 mcg by mouth daily before breakfast.  . lidocaine (XYLOCAINE) 2 % solution Take 5 mLs by mouth 4 (four) times daily as needed.  . loratadine (CLARITIN) 10 MG tablet Take 10 mg by mouth daily.  . methocarbamol (ROBAXIN) 750 MG tablet Take 750 mg by mouth every 6 (six) hours as needed for muscle spasms.  . metoprolol succinate (TOPROL-XL) 50 MG 24 hr tablet Take 50 mg by mouth daily. Take with or immediately following a meal.  . Multiple Vitamins-Minerals (PRESERVISION AREDS) CAPS Take 1 capsule by mouth daily. Take 1 tab twice a day  . pantoprazole (PROTONIX) 40 MG tablet Take by mouth.  . pravastatin (PRAVACHOL) 80 MG tablet Take 80 mg by mouth daily.  . promethazine (PHENERGAN) 25 MG suppository Place rectally.  . sertraline (ZOLOFT) 100 MG tablet Take 100 mg by mouth daily.  . vitamin C (ASCORBIC ACID) 500 MG tablet Take 500 mg by mouth daily.   No current facility-administered medications for this visit. (Other)      REVIEW OF SYSTEMS:  ALLERGIES Allergies  Allergen Reactions  . Lisinopril Itching    itching Other  . Sulfa Antibiotics Anaphylaxis  . Ciprofloxacin Hcl     Other reaction(s): Other Muscle pain  . Nitrofurantoin Itching    Other Other  . Hydrochlorothiazide Other (See Comments)    Cramps  Cramps Cramps Other    PAST MEDICAL HISTORY Past Medical History:  Diagnosis Date  . Anemia   . Anxiety   . Aortic stenosis, moderate   . CHF (congestive heart failure) ([place]) 2006  . DDD (degenerative disc disease), lumbar   . Full dentures    upper  . GERD (gastroesophageal reflux disease)   . Hearing loss of both ears    wears hearing aides  . Hepatitis 1964  . HTN  (hypertension), benign    sees Dr Peter Martinique annually to check heart valve  . Hypothyroidism   . Macular degeneration, bilateral   . Migraine   . Mixed hyperlipidemia   . Murmur   . Osteoarthritis   . PNA (pneumonia) 2006  . PONV (postoperative nausea and vomiting)   . Syncope   . UTI (lower urinary tract infection)    Past Surgical History:  Procedure Laterality Date  . BACK SURGERY  2004  . CATARACT EXTRACTION    . COLONOSCOPY W/ POLYPECTOMY  2016  . EYE SURGERY     bilateral detatched retina  . FOOT SURGERY    . LUMBAR FUSION  2016  . THYROID SURGERY      FAMILY HISTORY Family History  Problem Relation Age of Onset  . Hypertension Mother   . Stroke Mother   . Diabetes Father   . Hypertension Brother   . Rheumatic fever Brother   . Diabetes Other   . Hypertension Other   . Stroke Other   . Arthritis Other   . Migraines Other     SOCIAL HISTORY Social History   Tobacco Use  . Smoking status: Never Smoker  . Smokeless tobacco: Never Used  Vaping Use  . Vaping Use: Never used  Substance Use Topics  . Alcohol use: No  . Drug use: No         OPHTHALMIC EXAM: Base Eye Exam    Visual Acuity (ETDRS)      Right Left   Dist [place] CF @ face NLP   Dist ph [place] NI NI       Tonometry (Tonopen, 1:44 PM)      Right Left   Pressure 17 49       Tonometry #2 (Tonopen, 1:44 PM)      Right Left   Pressure  51       Pupils      Dark Light Shape React APD   Right 3 3 Irregular Slow None   Left 5 5 Irregular Slow None       Visual Fields (Counting fingers)      Left Right   Restrictions Total superior temporal, inferior temporal, superior nasal, inferior nasal deficiencies Total superior temporal, inferior temporal, superior nasal, inferior nasal deficiencies       Extraocular Movement      Right Left    Full Full       Neuro/Psych    Oriented x3: Yes   Mood/Affect: Normal       Dilation    Right eye: 1.0% Mydriacyl, 2.5% Phenylephrine @ 1:44 PM         Slit Lamp and Fundus Exam    External Exam  Right Left   External Normal Normal       Slit Lamp Exam      Right Left   Lids/Lashes Normal Normal   Conjunctiva/Sclera White and quiet White and quiet   Cornea Clear Clear   Anterior Chamber Deep and quiet Shallow , Narrow angle, iris bombe, improved post yag pi's   Iris Round and reactive Iris bombe less extensive,, , PI's patent at 6, 5 [phone], 230, Posterior synechiae to iol, with iris capture superotemporal portion iol.   Lens Centered posterior chamber intraocular lens Anterior Dislocated superiotemporal edge anterior to iris with pupil block due to Post synechaie to capsule and iol,,,    Anterior Vitreous Normal Normal       Fundus Exam      Right Left   Disc Normal    C/D Ratio 0.45    Macula Intermediate age related macular degeneration    Vessels Normal    Periphery Normal           IMAGING AND PROCEDURES  Imaging and Procedures for 06/02/20  Color Fundus Photography Optos - OU - Both Eyes       Right Eye Progression has been stable. Disc findings include normal observations. Macula : geographic atrophy, drusen. Vessels : normal observations.   Left Eye Progression has been stable. Disc findings include increased cup to disc ratio, pallor. Macula : geographic atrophy, drusen.   Notes OD, geographic atrophy adjacent and in the macula right eye with no signs of CNVM  OS with geographic atrophy in the macular region.  Media clear, history of scleral buckle, good chorioretinal scarring peripherally with optic nerve totally cupped and pale       OCT, Retina - OU - Both Eyes       Right Eye Quality was poor. Scan locations included subfoveal. Central Foveal Thickness: 175. Progression has been stable. Findings include abnormal foveal contour, outer retinal atrophy, central retinal atrophy.   Left Eye Quality was poor. Scan locations included subfoveal. Central Foveal Thickness: 263. Progression has  been stable. Findings include abnormal foveal contour, outer retinal atrophy, central retinal atrophy, inner retinal atrophy.                 ASSESSMENT/PLAN:  Blind hypertensive left eye Headache is developed around the left eye likely on the basis of ongoing iris bombe in a blind uncomfortable eye.  Will offer cycloplegia, with atropine 1% once or twice daily as needed (as needed) so as to provide comfort  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 involvement Advanced dry atrophic ARMD proxy 12 disc areas in size  accounts for acuity  Peripheral anterior synechiae of iris, left No change      ICD-10-CM   1. 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 involvement  H35.3114 Color Fundus Photography Optos - OU - Both Eyes    OCT, Retina - OU - Both Eyes  2. Blind hypertensive left eye  H35.032   3. Peripheral anterior synechiae of iris, left  H21.522     1.  OD with dry atrophic ARMD no interval change in anatomy nor in appearance patient does note there is an ongoing slight decline  2.  OS with totally cupped eye secondary to occult development of blind hypertensive eye some over a year ago.  Goal in the left eye is comfort  Will you use cycloplegia on a as needed basis 1 drop once or twice daily as needed  3.  Drop safety was discussed with the patient so as not to get systemic side effect of the atropine  Ophthalmic Meds Ordered this visit:  Meds ordered this encounter  Medications  . atropine 1 % ophthalmic solution    Sig: Place 1 drop into the left eye 2 (two) times daily.    Dispense:  2 mL    Refill:  14       Return in about 1 year (around 06/02/2021) for DILATE OU, COLOR FP.  There are no Patient Instructions on file for this visit.   Explained the diagnoses, plan, and follow up with the patient and they expressed understanding.  Patient expressed understanding of the  importance of proper follow up care.   Clent Demark Bernis Schreur M.D. Diseases & Surgery of the Retina and Vitreous Retina & Diabetic [place] 06/02/20     Abbreviations: M myopia (nearsighted); A astigmatism; H hyperopia (farsighted); P presbyopia; Mrx spectacle prescription;  CTL contact lenses; OD right eye; OS left eye; OU both eyes  XT exotropia; ET esotropia; PEK punctate epithelial keratitis; PEE punctate epithelial erosions; DES dry eye syndrome; MGD meibomian gland dysfunction; ATs artificial tears; PFAT's preservative free artificial tears; [place] nuclear sclerotic cataract; PSC posterior subcapsular cataract; ERM epi-retinal membrane; PVD posterior vitreous detachment; RD retinal detachment; DM diabetes mellitus; DR diabetic retinopathy; NPDR non-proliferative diabetic retinopathy; PDR proliferative diabetic retinopathy; CSME clinically significant macular edema; DME diabetic macular edema; dbh dot blot hemorrhages; CWS cotton wool spot; POAG primary open angle glaucoma; C/D cup-to-disc ratio; HVF humphrey visual field; GVF goldmann visual field; OCT optical coherence tomography; IOP intraocular pressure; BRVO Branch retinal vein occlusion; CRVO central retinal vein occlusion; CRAO central retinal artery occlusion; BRAO branch retinal artery occlusion; RT retinal tear; SB scleral buckle; PPV pars plana vitrectomy; VH Vitreous hemorrhage; PRP panretinal laser photocoagulation; IVK intravitreal kenalog; VMT vitreomacular traction; MH Macular hole;  NVD neovascularization of the disc; NVE neovascularization elsewhere; AREDS age related eye disease study; ARMD age related macular degeneration; POAG primary open angle glaucoma; EBMD epithelial/anterior basement membrane dystrophy; ACIOL anterior chamber intraocular lens; IOL intraocular lens; PCIOL posterior chamber intraocular lens; Phaco/IOL phacoemulsification with intraocular lens placement; [place] photorefractive keratectomy; LASIK laser assisted in situ  keratomileusis; HTN hypertension; DM diabetes mellitus; COPD chronic obstructive pulmonary disease

## 2020-06-02 NOTE — Assessment & Plan Note (Signed)
No change 

## 2020-06-02 NOTE — Assessment & Plan Note (Signed)
Headache is developed around the left eye likely on the basis of ongoing iris bombe in a blind uncomfortable eye.  Will offer cycloplegia, with atropine 1% once or twice daily as needed (as needed) so as to provide comfort

## 2020-08-25 ENCOUNTER — Other Ambulatory Visit (INDEPENDENT_AMBULATORY_CARE_PROVIDER_SITE_OTHER): Payer: Self-pay | Admitting: Ophthalmology

## 2021-01-19 ENCOUNTER — Other Ambulatory Visit: Payer: Self-pay

## 2021-01-19 ENCOUNTER — Ambulatory Visit (HOSPITAL_COMMUNITY): Payer: Medicare Other | Attending: Cardiology

## 2021-01-19 DIAGNOSIS — I350 Nonrheumatic aortic (valve) stenosis: Secondary | ICD-10-CM | POA: Insufficient documentation

## 2021-01-19 LAB — ECHOCARDIOGRAM COMPLETE
AR max vel: 1.04 cm2
AV Area VTI: 1.18 cm2
AV Area mean vel: 1.12 cm2
AV Mean grad: 20.5 mmHg
AV Peak grad: 37.1 mmHg
Ao pk vel: 3.05 m/s
Area-P 1/2: 2.42 cm2
P 1/2 time: 587 msec
S' Lateral: 1.4 cm

## 2021-06-08 ENCOUNTER — Encounter (INDEPENDENT_AMBULATORY_CARE_PROVIDER_SITE_OTHER): Payer: Medicare Other | Admitting: Ophthalmology

## 2021-06-15 ENCOUNTER — Encounter (INDEPENDENT_AMBULATORY_CARE_PROVIDER_SITE_OTHER): Payer: Self-pay | Admitting: Ophthalmology

## 2021-06-15 ENCOUNTER — Ambulatory Visit (INDEPENDENT_AMBULATORY_CARE_PROVIDER_SITE_OTHER): Payer: Medicare Other | Admitting: Ophthalmology

## 2021-06-15 DIAGNOSIS — H35032 Hypertensive retinopathy, left eye: Secondary | ICD-10-CM

## 2021-06-15 DIAGNOSIS — H353222 Exudative age-related macular degeneration, left eye, with inactive choroidal neovascularization: Secondary | ICD-10-CM | POA: Diagnosis not present

## 2021-06-15 DIAGNOSIS — H353114 Nonexudative age-related macular degeneration, right eye, advanced atrophic with subfoveal involvement: Secondary | ICD-10-CM

## 2021-06-15 DIAGNOSIS — H4020X0 Unspecified primary angle-closure glaucoma, stage unspecified: Secondary | ICD-10-CM

## 2021-06-15 DIAGNOSIS — T8522XA Displacement of intraocular lens, initial encounter: Secondary | ICD-10-CM | POA: Insufficient documentation

## 2021-06-15 DIAGNOSIS — H21522 Goniosynechiae, left eye: Secondary | ICD-10-CM

## 2021-06-15 MED ORDER — PREDNISOLONE ACETATE 1 % OP SUSP: OPHTHALMIC | 12 refills | Status: DC

## 2021-06-15 NOTE — Assessment & Plan Note (Signed)
End-stage OS

## 2021-06-15 NOTE — Assessment & Plan Note (Signed)
Secondary to Neovasc

## 2021-06-15 NOTE — Assessment & Plan Note (Signed)
OS, with extensive anterior segment neovascularization, NLP vision, goal is currently simply comfort.  She has occasional headaches but does not relate them to that region around the eye

## 2021-06-15 NOTE — Assessment & Plan Note (Signed)
May be a candidate for Syfovre will return follow-up visit in 5 months

## 2021-06-15 NOTE — Assessment & Plan Note (Addendum)
Partial IOL optic capture OD.  Follow-up Dr. Dennison Mascot as scheduled

## 2021-06-15 NOTE — Assessment & Plan Note (Signed)
Visualized posteriorly

## 2021-06-15 NOTE — Progress Notes (Signed)
06/15/2021     CHIEF COMPLAINT Patient presents for  Chief Complaint  Patient presents with   Macular Degeneration      HISTORY OF PRESENT ILLNESS: Aimee Ward is a 88 y.o. female who presents to the clinic today for:   HPI   1 yr fu OU FP. Patient allergy to Ciprofloxacin. Patient reports she thinks her vision is worsening, she has flashes of light that are longstanding in the right eye. Patient uses Latanoprost OD QHS, and Atropine BID OS. Last edited by Laurin Coder on 06/15/2021  1:29 PM.      Referring physician: Lonia Skinner, MD [address] [place],  [place] [address]  HISTORICAL INFORMATION:   Selected notes from the MEDICAL RECORD NUMBER       CURRENT MEDICATIONS: Current Outpatient Medications (Ophthalmic Drugs)  Medication Sig   prednisoLONE acetate (PRED FORTE) 1 % ophthalmic suspension INSTILL 1 DROP INTO LEFT EYE 4 TIMES A DAY   No current facility-administered medications for this visit. (Ophthalmic Drugs)   Current Outpatient Medications (Other)  Medication Sig   acetic acid-hydrocortisone (VOSOL-HC) OTIC solution PLEASE SEE ATTACHED FOR DETAILED DIRECTIONS   ALPRAZolam (XANAX) 0.5 MG tablet Take 0.25 mg by mouth at bedtime as needed for anxiety or sleep.    cholecalciferol (VITAMIN D3) 25 MCG (1000 UT) tablet Take 1,000 Units by mouth daily.   ESTRACE VAGINAL 0.1 MG/GM vaginal cream Apply 1 application topically every other day.    fluconazole (DIFLUCAN) 200 MG tablet Take by mouth.   gabapentin (NEURONTIN) 100 MG capsule Take 100 mg by mouth 3 (three) times daily.   HYDROcodone-acetaminophen (NORCO/VICODIN) 5-325 MG tablet Take 1 tablet by mouth every 6 (six) hours as needed.   hydrocortisone 2.5 % cream Apply 1 application topically 2 (two) times daily as needed.   levothyroxine (SYNTHROID, LEVOTHROID) 88 MCG tablet Take 88 mcg by mouth daily before breakfast.   lidocaine (XYLOCAINE) 2 % solution Take 5 mLs by mouth 4  (four) times daily as needed.   loratadine (CLARITIN) 10 MG tablet Take 10 mg by mouth daily.   methocarbamol (ROBAXIN) 750 MG tablet Take 750 mg by mouth every 6 (six) hours as needed for muscle spasms.   metoprolol succinate (TOPROL-XL) 50 MG 24 hr tablet Take 50 mg by mouth daily. Take with or immediately following a meal.   Multiple Vitamins-Minerals (PRESERVISION AREDS) CAPS Take 1 capsule by mouth daily. Take 1 tab twice a day   pantoprazole (PROTONIX) 40 MG tablet Take by mouth.   pravastatin (PRAVACHOL) 80 MG tablet Take 80 mg by mouth daily.   promethazine (PHENERGAN) 25 MG suppository Place rectally.   sertraline (ZOLOFT) 100 MG tablet Take 100 mg by mouth daily.   vitamin C (ASCORBIC ACID) 500 MG tablet Take 500 mg by mouth daily.   No current facility-administered medications for this visit. (Other)      REVIEW OF SYSTEMS:    ALLERGIES Allergies  Allergen Reactions   Lisinopril Itching    itching Other   Sulfa Antibiotics Anaphylaxis   Ciprofloxacin Hcl     Other reaction(s): Other Muscle pain   Nitrofurantoin Itching    Other Other   Hydrochlorothiazide Other (See Comments)    Cramps  Cramps Cramps Other    PAST MEDICAL HISTORY Past Medical History:  Diagnosis Date   Anemia    Anxiety    Aortic stenosis, moderate    CHF (congestive heart failure) ([place]) 2006   DDD (  degenerative disc disease), lumbar    Full dentures    upper   GERD (gastroesophageal reflux disease)    Hearing loss of both ears    wears hearing aides   Hepatitis 1964   HTN (hypertension), benign    sees Dr Peter Martinique annually to check heart valve   Hypothyroidism    Macular degeneration, bilateral    Migraine    Mixed hyperlipidemia    Murmur    Osteoarthritis    PNA (pneumonia) 2006   PONV (postoperative nausea and vomiting)    Syncope    UTI (lower urinary tract infection)    Past Surgical History:  Procedure Laterality Date   BACK SURGERY  2004   CATARACT  EXTRACTION     COLONOSCOPY W/ POLYPECTOMY  2016   EYE SURGERY     bilateral detatched retina   FOOT SURGERY     LUMBAR FUSION  2016   THYROID SURGERY      FAMILY HISTORY Family History  Problem Relation Age of Onset   Hypertension Mother    Stroke Mother    Diabetes Father    Hypertension Brother    Rheumatic fever Brother    Diabetes Other    Hypertension Other    Stroke Other    Arthritis Other    Migraines Other     SOCIAL HISTORY Social History   Tobacco Use   Smoking status: Never   Smokeless tobacco: Never  Vaping Use   Vaping Use: Never used  Substance Use Topics   Alcohol use: No   Drug use: No         OPHTHALMIC EXAM:  Base Eye Exam     Visual Acuity (ETDRS)       Right Left   Dist cc HM NLP    Correction: Glasses         Tonometry (Tonopen, 1:30 PM)       Right Left   Pressure 19 32         Pupils       Shape APD   Right Irregular None   Left Irregular None         Visual Fields (Counting fingers)       Left Right   Restrictions Total superior temporal, inferior temporal, superior nasal, inferior nasal deficiencies Total superior temporal, inferior temporal, superior nasal, inferior nasal deficiencies         Extraocular Movement       Right Left    Full Full         Neuro/Psych     Oriented x3: Yes   Mood/Affect: Normal         Dilation     Both eyes: 1.0% Mydriacyl, 2.5% Phenylephrine @ 1:30 PM           Slit Lamp and Fundus Exam     External Exam       Right Left   External Normal Normal         Slit Lamp Exam       Right Left   Lids/Lashes Normal Normal   Conjunctiva/Sclera White and quiet White and quiet   Cornea Clear Clear   Anterior Chamber Deep and quiet Shallow , Narrow angle, iris bombe, improved post yag pi's   Iris Round and reactive Iris bombe with 360 PAS, ANGLE closure, PI's patent at 6, 5 [phone], 230, Posterior synechiae to iol, with iris capture superotemporal portion  iol., Neovascularization large 360   Lens Centered posterior  chamber intraocular lens, partially inferior optic capture by pupil Anterior Dislocated superiotemporal edge anterior to iris with pupil block due to Post synechaie to capsule and iol,    Anterior Vitreous Normal Normal         Fundus Exam       Right Left   Posterior Vitreous  Clear view   Disc Normal 4+ Pallor   C/D Ratio 0.45 1.0   Macula Intermediate age related macular degeneration Intermediate age related macular degeneration, Macular atrophy   Vessels Normal Thin attenuated   Periphery Normal Normal            IMAGING AND PROCEDURES  Imaging and Procedures for 06/15/21  Color Fundus Photography Optos - OU - Both Eyes       Right Eye Progression has been stable. Disc findings include normal observations. Macula : geographic atrophy, drusen. Vessels : normal observations. Periphery : normal observations.   Left Eye Progression has been stable. Disc findings include increased cup to disc ratio, pallor. Macula : geographic atrophy, drusen.   Notes OD, geographic atrophy adjacent and in the macula right eye with no signs of CNVM  OS with geographic atrophy in the macular region.  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  Follow-up Dr. Dennison Mascot as scheduled  Blind hypertensive left eye OS, with extensive anterior segment neovascularization, NLP vision, goal is currently simply comfort.  She has occasional headaches but does not relate them to that region around the eye  Exudative age-related macular degeneration of left eye with inactive choroidal  neovascularization (HCC) Visualized posteriorly  Narrow angle glaucoma of left eye End-stage OS  Peripheral anterior synechiae of iris, left Secondary to Neovasc     ICD-10-CM   1. 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 Displacement of intraocular lens, initial encounter  T85.22XA     3. Blind hypertensive left eye  H35.032     4. Exudative age-related macular degeneration of left eye with inactive choroidal neovascularization ([place])  H35.3222     5. Narrow angle glaucoma of left eye  H40.20X0     6. Peripheral anterior synechiae of iris, left  H21.522       1.  OD with dry atrophic ARMD.  Documented enlargement of the atrophic region of the macula clinicians macula OD over the last 3 years by measurement of the region of atrophy by red free and autofluorescence.  2.  OD may be a candidate for Syfovre in the future discussed next exam  3 OD with partial anterior subluxation of IOL.  No obvious secondary inflammation at this time  4.  Follow-up Dennison Mascot as scheduled  Ophthalmic Meds Ordered this visit:  No orders of the defined types were placed in this encounter.      Return in about 5 months (around 11/15/2021) for dilate, OD, COLOR FP,, discussed Syfovre use.  There are no Patient Instructions on file for this visit.   Explained the diagnoses, plan, and follow up with the patient and they expressed understanding.  Patient expressed understanding  of the importance of proper follow up care.   Clent Demark Dreyson Mishkin M.D. Diseases & Surgery of the Retina and Vitreous Retina & Diabetic [place] 06/15/21     Abbreviations: M myopia (nearsighted); A astigmatism; H hyperopia (farsighted); P presbyopia; Mrx spectacle prescription;  CTL contact lenses; OD right eye; OS left eye; OU both eyes  XT exotropia; ET esotropia; PEK punctate epithelial keratitis;  PEE punctate epithelial erosions; DES dry eye syndrome; MGD meibomian gland dysfunction; ATs artificial tears; PFAT's preservative free artificial tears; [place] nuclear sclerotic cataract; PSC posterior subcapsular cataract; ERM epi-retinal membrane; PVD posterior vitreous detachment; RD retinal detachment; DM diabetes mellitus; DR diabetic retinopathy; NPDR non-proliferative diabetic retinopathy; PDR proliferative diabetic retinopathy; CSME clinically significant macular edema; DME diabetic macular edema; dbh dot blot hemorrhages; CWS cotton wool spot; POAG primary open angle glaucoma; C/D cup-to-disc ratio; HVF humphrey visual field; GVF goldmann visual field; OCT optical coherence tomography; IOP intraocular pressure; BRVO Branch retinal vein occlusion; CRVO central retinal vein occlusion; CRAO central retinal artery occlusion; BRAO branch retinal artery occlusion; RT retinal tear; SB scleral buckle; PPV pars plana vitrectomy; VH Vitreous hemorrhage; PRP panretinal laser photocoagulation; IVK intravitreal kenalog; VMT vitreomacular traction; MH Macular hole;  NVD neovascularization of the disc; NVE neovascularization elsewhere; AREDS age related eye disease study; ARMD age related macular degeneration; POAG primary open angle glaucoma; EBMD epithelial/anterior basement membrane dystrophy; ACIOL anterior chamber intraocular lens; IOL intraocular lens; PCIOL posterior chamber intraocular lens; Phaco/IOL phacoemulsification with intraocular lens placement; [place] photorefractive keratectomy; LASIK laser assisted in situ keratomileusis; HTN hypertension; DM diabetes mellitus; COPD chronic obstructive pulmonary disease

## 2021-06-19 ENCOUNTER — Other Ambulatory Visit (INDEPENDENT_AMBULATORY_CARE_PROVIDER_SITE_OTHER): Payer: Self-pay | Admitting: Ophthalmology

## 2021-11-15 ENCOUNTER — Encounter (INDEPENDENT_AMBULATORY_CARE_PROVIDER_SITE_OTHER): Payer: Medicare Other | Admitting: Ophthalmology

## 2022-04-12 ENCOUNTER — Emergency Department (HOSPITAL_COMMUNITY)
Admission: EM | Admit: 2022-04-12 | Discharge: 2022-04-12 | Disposition: A | Discharge status: Home / Self Care | Payer: Medicare Other | Attending: Emergency Medicine | Admitting: Emergency Medicine

## 2022-04-12 ENCOUNTER — Other Ambulatory Visit: Payer: Self-pay

## 2022-04-12 ENCOUNTER — Encounter (HOSPITAL_COMMUNITY): Payer: Self-pay

## 2022-04-12 DIAGNOSIS — N3000 Acute cystitis without hematuria: Secondary | ICD-10-CM | POA: Diagnosis not present

## 2022-04-12 DIAGNOSIS — E860 Dehydration: Secondary | ICD-10-CM | POA: Insufficient documentation

## 2022-04-12 DIAGNOSIS — R55 Syncope and collapse: Secondary | ICD-10-CM | POA: Insufficient documentation

## 2022-04-12 LAB — D-DIMER, QUANTITATIVE: D-Dimer, Quant: 0.68 ug/mL-FEU — ABNORMAL HIGH (ref 0.00–0.50)

## 2022-04-12 LAB — URINALYSIS, ROUTINE W REFLEX MICROSCOPIC
Bilirubin Urine: NEGATIVE
Glucose, UA: NEGATIVE mg/dL
Hgb urine dipstick: NEGATIVE
Ketones, ur: NEGATIVE mg/dL
Nitrite: POSITIVE — AB
Protein, ur: NEGATIVE mg/dL
Specific Gravity, Urine: 1.009 (ref 1.005–1.030)
pH: 5 (ref 5.0–8.0)

## 2022-04-12 LAB — BASIC METABOLIC PANEL
Anion gap: 8 (ref 5–15)
BUN: 21 mg/dL (ref 8–23)
CO2: 22 mmol/L (ref 22–32)
Calcium: 9.2 mg/dL (ref 8.9–10.3)
Chloride: 104 mmol/L (ref 98–111)
Creatinine, Ser: 1.04 mg/dL — ABNORMAL HIGH (ref 0.44–1.00)
GFR, Estimated: 52 mL/min — ABNORMAL LOW (ref >60)
Glucose, Bld: 96 mg/dL (ref 70–99)
Potassium: 5.1 mmol/L (ref 3.5–5.1)
Sodium: 134 mmol/L — ABNORMAL LOW (ref 135–145)

## 2022-04-12 LAB — CBC
HCT: 38.2 % (ref 36.0–46.0)
Hemoglobin: 12.5 g/dL (ref 12.0–15.0)
MCH: 31.6 pg (ref 26.0–34.0)
MCHC: 32.7 g/dL (ref 30.0–36.0)
MCV: 96.7 fL (ref 80.0–100.0)
Platelets: 198 10*3/uL (ref 150–400)
RBC: 3.95 MIL/uL (ref 3.87–5.11)
RDW: 13.4 % (ref 11.5–15.5)
WBC: 6.1 10*3/uL (ref 4.0–10.5)
nRBC: 0 % (ref 0.0–0.2)

## 2022-04-12 MED ORDER — CEPHALEXIN 500 MG PO CAPS
500.0000 mg | ORAL_CAPSULE | Freq: Once | ORAL | Status: AC
  Administered 2022-04-12: 500 mg via ORAL
  Filled 2022-04-12: qty 1

## 2022-04-12 MED ORDER — LACTATED RINGERS IV BOLUS
1000.0000 mL | Freq: Once | INTRAVENOUS | Status: AC
  Administered 2022-04-12: 1000 mL via INTRAVENOUS

## 2022-04-12 MED ORDER — CEPHALEXIN 500 MG PO CAPS: 500.0000 mg | ORAL_CAPSULE | Freq: Two times a day (BID) | ORAL | 0 refills | Status: DC

## 2022-04-12 NOTE — ED Notes (Signed)
See triage notes. Pt denies dizziness at this time.

## 2022-04-12 NOTE — Discharge Instructions (Signed)
You are seen in the emergency department for a near syncopal episode. Thankfully your labs were largely normal but there was signs of a potential UTI. Given your symptoms of feeling weak and lightheaded at times, I believe treating you for this infection is the most reasonable approach at this time. Thankfully there was no evidence of a clot anywhere as your d-dimer test was negative. Please return to the emergency department if your symptoms worsen

## 2022-04-12 NOTE — ED Provider Notes (Signed)
[facility] Provider Note   CSN: Tse Bonito:[id number] Arrival date & time: 04/12/22  1227     History Chief Complaint  Patient presents with   Near Syncope    Aimee Ward is a 89 y.o. female.  Patient presents emergency department with complaints of near syncope.  Patient reports she has had multiple near syncopal episodes for the last 2 months or so but has recent become somewhat worse.  Reports that her primary care provider was concerned for possible hypotensive episodes due to orthostatic hypotension.  PCP also sent patient to rule out potential DVT or PE. Patient not currently reporting shortness of breath, chest pain, leg swelling, or leg pain.   Near Syncope       Home Medications Prior to Admission medications   Medication Sig Start Date End Date Taking? Authorizing Provider  cephALEXin (KEFLEX) 500 MG capsule Take 1 capsule (500 mg total) by mouth 2 (two) times daily. 04/12/22  Yes Luvenia Heller, PA-C  acetic acid-hydrocortisone (VOSOL-HC) OTIC solution PLEASE SEE ATTACHED FOR DETAILED DIRECTIONS 02/28/20   [provider]  ALPRAZolam Duanne Moron) 0.5 MG tablet Take 0.25 mg by mouth at bedtime as needed for anxiety or sleep.     [provider]  atropine 1 % ophthalmic solution INSTILL 1 DROP INTO LEFT EYE TWICE A DAY 06/19/21   Rankin, Clent Demark, MD  cholecalciferol (VITAMIN D3) 25 MCG (1000 UT) tablet Take 1,000 Units by mouth daily.    [provider]  ESTRACE VAGINAL 0.1 MG/GM vaginal cream Apply 1 application topically every other day.  07/29/14   [provider]  fluconazole (DIFLUCAN) 200 MG tablet Take by mouth.    [provider]  gabapentin (NEURONTIN) 100 MG capsule Take 100 mg by mouth 3 (three) times daily. 01/18/18   [provider]  HYDROcodone-acetaminophen (NORCO/VICODIN) 5-325 MG tablet Take 1 tablet by mouth every 6 (six) hours as needed. 09/26/14   [provider]   hydrocortisone 2.5 % cream Apply 1 application topically 2 (two) times daily as needed. 01/29/18   [provider]  levothyroxine (SYNTHROID, LEVOTHROID) 88 MCG tablet Take 88 mcg by mouth daily before breakfast.    [provider]  lidocaine (XYLOCAINE) 2 % solution Take 5 mLs by mouth 4 (four) times daily as needed. 07/06/17   [provider]  loratadine (CLARITIN) 10 MG tablet Take 10 mg by mouth daily.    [provider]  methocarbamol (ROBAXIN) 750 MG tablet Take 750 mg by mouth every 6 (six) hours as needed for muscle spasms.    [provider]  metoprolol succinate (TOPROL-XL) 50 MG 24 hr tablet Take 50 mg by mouth daily. Take with or immediately following a meal.    [provider]  Multiple Vitamins-Minerals (PRESERVISION AREDS) CAPS Take 1 capsule by mouth daily. Take 1 tab twice a day    [provider]  pantoprazole (PROTONIX) 40 MG tablet Take by mouth. 02/06/19   [provider]  pravastatin (PRAVACHOL) 80 MG tablet Take 80 mg by mouth daily.    [provider]  prednisoLONE acetate (PRED FORTE) 1 % ophthalmic suspension INSTILL 1 DROP INTO LEFT EYE 1 TIME A DAY 06/15/21 06/16/22  Rankin, Clent Demark, MD  promethazine (PHENERGAN) 25 MG suppository Place rectally. 01/21/19   [provider]  sertraline (ZOLOFT) 100 MG tablet Take 100 mg by mouth daily. 01/18/18   [provider]  vitamin C (ASCORBIC ACID)  500 MG tablet Take 500 mg by mouth daily.    [provider]      Allergies    Lisinopril, Sulfa antibiotics, Ciprofloxacin hcl, Nitrofurantoin, and Hydrochlorothiazide    Review of Systems   Review of Systems  Cardiovascular:  Positive for near-syncope.  Neurological:  Positive for light-headedness.       Near syncope  All other systems reviewed and are negative.   Physical Exam Updated Vital Signs BP (!) 142/93   Pulse 69   Temp 98 F (36.7 C)   Resp 17   Ht 5\' 3"  (1.6 m)    Wt 68 kg   SpO2 100%   BMI 26.57 kg/m  Physical Exam Vitals and nursing note reviewed.  Constitutional:      General: She is not in acute distress.    Appearance: She is well-developed.  HENT:     Head: Normocephalic and atraumatic.  Eyes:     Conjunctiva/sclera: Conjunctivae normal.  Cardiovascular:     Rate and Rhythm: Normal rate and regular rhythm.     Heart sounds: No murmur heard. Pulmonary:     Effort: Pulmonary effort is normal. No respiratory distress.     Breath sounds: Normal breath sounds.  Abdominal:     Palpations: Abdomen is soft.     Tenderness: There is no abdominal tenderness.  Musculoskeletal:        General: No swelling.     Cervical back: Neck supple.  Skin:    General: Skin is warm and dry.     Capillary Refill: Capillary refill takes less than 2 seconds.  Neurological:     Mental Status: She is alert.  Psychiatric:        Mood and Affect: Mood normal.     ED Results / Procedures / Treatments   Labs (all labs ordered are listed, but only abnormal results are displayed) Labs Reviewed  BASIC METABOLIC PANEL - Abnormal; Notable for the following components:      Result Value   Sodium 134 (*)    Creatinine, Ser 1.04 (*)    GFR, Estimated 52 (*)    All other components within normal limits  URINALYSIS, ROUTINE W REFLEX MICROSCOPIC - Abnormal; Notable for the following components:   APPearance HAZY (*)    Nitrite POSITIVE (*)    Leukocytes,Ua MODERATE (*)    Bacteria, UA MANY (*)    All other components within normal limits  D-DIMER, QUANTITATIVE - Abnormal; Notable for the following components:   D-Dimer, Quant 0.68 (*)    All other components within normal limits  CBC    EKG EKG Interpretation  Date/Time:  Tuesday April 12 2022 13:01:33 EDT Ventricular Rate:  84 PR Interval:  162 QRS Duration: 68 QT Interval:  362 QTC Calculation: 427 R Axis:   2 Text Interpretation: Normal sinus rhythm Minimal voltage criteria for LVH, may be  normal variant ( R in aVL ) Borderline ECG When compared with ECG of 04-Apr-2015 11:55, No significant change since last tracing Confirmed by Garnette Gunner [phone]) on 04/12/2022 3:31:09 PM  Radiology No results found.  Procedures Procedures   Medications Ordered in ED Medications  lactated ringers bolus 1,000 mL (0 mLs Intravenous Stopped 04/12/22 1841)  cephALEXin (KEFLEX) capsule 500 mg (500 mg Oral Given 04/12/22 2030)    ED Course/ Medical Decision Making/ A&P  Medical Decision Making Amount and/or Complexity of Data Reviewed Labs: ordered.  Risk Prescription drug management.   This patient presents to the ED for concern of near syncope. Differential diagnosis includes orthostatic hypotension, dehydration, PE, heart arrhythmia   Lab Tests:  I Ordered, and personally interpreted labs.  The pertinent results include:  CBC normal, CMP with mild dehydration with Na at 134, D-dimer negative with age adjustment, UA with evidence of UTI.   Imaging Studies ordered:  I ordered imaging studies including [place] lower left  Test was not performed as US imaging was not available this evening   Medicines ordered and prescription drug management:  I ordered medication including fluids, Keflex  for dehydration, UTI  Reevaluation of the patient after these medicines showed that the patient improved I have reviewed the patients home medicines and have made adjustments as needed   Problem List / ED Course:  Patient presented to the ED for near syncope. PCP primarily concerned for possible PE or DVT given that patient is not currently on thinners. Patient not currently experiencing any concerning symptoms for PE such  as chest pain, shortness of breath, leg swelling, or recent immobilization. I believe that risk of PE or DVT is low but d-dimer will be ordered as [place] is not available at this time. D-dimer negative when age adjusted with cutoff level being 0.86.  Patient's symptoms not likely to be caused by a DVT or PE.  Informed patient of d-dimer findings and no need for CT of chest or [place] of legs at this time. Will treat for UTI seen on UA as this may be causing patient's generalized weakness feeling that she has been experiencing. Patient agreeable with treatment plan and verbalized understanding all return precautions. All questions answered prior to patient discharge.  Final Clinical Impression(s) / ED Diagnoses Final diagnoses:  Near syncope  Acute cystitis without hematuria    Rx / DC Orders ED Discharge Orders          Ordered    cephALEXin (KEFLEX) 500 MG capsule  2 times daily        04/12/22 2031              Luvenia Heller, PA-C 04/12/22 2342    Cristie Hem, MD 04/13/22 1254

## 2022-04-12 NOTE — ED Notes (Signed)
Pt ambulated to the restroom.

## 2022-04-12 NOTE — ED Triage Notes (Addendum)
Pt presents with near syncopal episodes with associated numbness that is generalized. Episodes are precipitated with standing. Pt states she has been having episodes for 2 months, but her PCP told her to come be evaluated today. Pt states she has had orthostatic hypotension. Denies any recent medication changes.   Pt also states she has L hip pain. Denies falls or injury.

## 2022-05-14 ENCOUNTER — Inpatient Hospital Stay (HOSPITAL_COMMUNITY)
Admission: EM | Admit: 2022-05-14 | Discharge: 2022-05-18 | DRG: 871 | Disposition: A | Discharge status: Home Health | Payer: Medicare Other | Attending: Internal Medicine | Admitting: Internal Medicine

## 2022-05-14 ENCOUNTER — Emergency Department (HOSPITAL_COMMUNITY): Payer: Medicare Other

## 2022-05-14 ENCOUNTER — Other Ambulatory Visit: Payer: Self-pay

## 2022-05-14 ENCOUNTER — Encounter (HOSPITAL_COMMUNITY): Payer: Self-pay

## 2022-05-14 DIAGNOSIS — I110 Hypertensive heart disease with heart failure: Secondary | ICD-10-CM | POA: Diagnosis present

## 2022-05-14 DIAGNOSIS — Z1152 Encounter for screening for COVID-19: Secondary | ICD-10-CM

## 2022-05-14 DIAGNOSIS — E872 Acidosis, unspecified: Secondary | ICD-10-CM | POA: Diagnosis present

## 2022-05-14 DIAGNOSIS — E039 Hypothyroidism, unspecified: Secondary | ICD-10-CM | POA: Diagnosis present

## 2022-05-14 DIAGNOSIS — F419 Anxiety disorder, unspecified: Secondary | ICD-10-CM | POA: Diagnosis present

## 2022-05-14 DIAGNOSIS — Z833 Family history of diabetes mellitus: Secondary | ICD-10-CM

## 2022-05-14 DIAGNOSIS — H9193 Unspecified hearing loss, bilateral: Secondary | ICD-10-CM | POA: Diagnosis present

## 2022-05-14 DIAGNOSIS — Z8249 Family history of ischemic heart disease and other diseases of the circulatory system: Secondary | ICD-10-CM

## 2022-05-14 DIAGNOSIS — D696 Thrombocytopenia, unspecified: Secondary | ICD-10-CM | POA: Diagnosis present

## 2022-05-14 DIAGNOSIS — Z66 Do not resuscitate: Secondary | ICD-10-CM | POA: Diagnosis present

## 2022-05-14 DIAGNOSIS — N390 Urinary tract infection, site not specified: Principal | ICD-10-CM

## 2022-05-14 DIAGNOSIS — I509 Heart failure, unspecified: Secondary | ICD-10-CM

## 2022-05-14 DIAGNOSIS — N10 Acute tubulo-interstitial nephritis: Secondary | ICD-10-CM | POA: Diagnosis present

## 2022-05-14 DIAGNOSIS — Z79890 Hormone replacement therapy (postmenopausal): Secondary | ICD-10-CM

## 2022-05-14 DIAGNOSIS — R6520 Severe sepsis without septic shock: Secondary | ICD-10-CM | POA: Diagnosis present

## 2022-05-14 DIAGNOSIS — Z8701 Personal history of pneumonia (recurrent): Secondary | ICD-10-CM

## 2022-05-14 DIAGNOSIS — E860 Dehydration: Secondary | ICD-10-CM | POA: Diagnosis present

## 2022-05-14 DIAGNOSIS — A4151 Sepsis due to Escherichia coli [E. coli]: Principal | ICD-10-CM | POA: Diagnosis present

## 2022-05-14 DIAGNOSIS — Z87440 Personal history of urinary (tract) infections: Secondary | ICD-10-CM

## 2022-05-14 DIAGNOSIS — I5033 Acute on chronic diastolic (congestive) heart failure: Secondary | ICD-10-CM | POA: Diagnosis not present

## 2022-05-14 DIAGNOSIS — Z888 Allergy status to other drugs, medicaments and biological substances status: Secondary | ICD-10-CM

## 2022-05-14 DIAGNOSIS — Z881 Allergy status to other antibiotic agents status: Secondary | ICD-10-CM

## 2022-05-14 DIAGNOSIS — Z882 Allergy status to sulfonamides status: Secondary | ICD-10-CM

## 2022-05-14 DIAGNOSIS — E871 Hypo-osmolality and hyponatremia: Secondary | ICD-10-CM | POA: Diagnosis present

## 2022-05-14 DIAGNOSIS — E861 Hypovolemia: Secondary | ICD-10-CM | POA: Diagnosis present

## 2022-05-14 DIAGNOSIS — K219 Gastro-esophageal reflux disease without esophagitis: Secondary | ICD-10-CM | POA: Diagnosis present

## 2022-05-14 DIAGNOSIS — Z981 Arthrodesis status: Secondary | ICD-10-CM

## 2022-05-14 DIAGNOSIS — A419 Sepsis, unspecified organism: Secondary | ICD-10-CM

## 2022-05-14 DIAGNOSIS — N179 Acute kidney failure, unspecified: Secondary | ICD-10-CM | POA: Diagnosis present

## 2022-05-14 DIAGNOSIS — E782 Mixed hyperlipidemia: Secondary | ICD-10-CM | POA: Diagnosis present

## 2022-05-14 DIAGNOSIS — M5136 Other intervertebral disc degeneration, lumbar region: Secondary | ICD-10-CM | POA: Diagnosis present

## 2022-05-14 DIAGNOSIS — Z823 Family history of stroke: Secondary | ICD-10-CM

## 2022-05-14 DIAGNOSIS — I350 Nonrheumatic aortic (valve) stenosis: Secondary | ICD-10-CM | POA: Diagnosis present

## 2022-05-14 DIAGNOSIS — H3530 Unspecified macular degeneration: Secondary | ICD-10-CM | POA: Diagnosis present

## 2022-05-14 DIAGNOSIS — I10 Essential (primary) hypertension: Secondary | ICD-10-CM | POA: Diagnosis present

## 2022-05-14 DIAGNOSIS — R531 Weakness: Secondary | ICD-10-CM | POA: Diagnosis present

## 2022-05-14 DIAGNOSIS — Z79899 Other long term (current) drug therapy: Secondary | ICD-10-CM

## 2022-05-14 LAB — COMPREHENSIVE METABOLIC PANEL
ALT: 11 U/L (ref 0–44)
AST: 18 U/L (ref 15–41)
Albumin: 3.1 g/dL — ABNORMAL LOW (ref 3.5–5.0)
Alkaline Phosphatase: 67 U/L (ref 38–126)
Anion gap: 10 (ref 5–15)
BUN: 41 mg/dL — ABNORMAL HIGH (ref 8–23)
CO2: 18 mmol/L — ABNORMAL LOW (ref 22–32)
Calcium: 8.1 mg/dL — ABNORMAL LOW (ref 8.9–10.3)
Chloride: 100 mmol/L (ref 98–111)
Creatinine, Ser: 1.92 mg/dL — ABNORMAL HIGH (ref 0.44–1.00)
GFR, Estimated: 25 mL/min — ABNORMAL LOW (ref >60)
Glucose, Bld: 113 mg/dL — ABNORMAL HIGH (ref 70–99)
Potassium: 4.3 mmol/L (ref 3.5–5.1)
Sodium: 128 mmol/L — ABNORMAL LOW (ref 135–145)
Total Bilirubin: 1 mg/dL (ref 0.3–1.2)
Total Protein: 7.5 g/dL (ref 6.5–8.1)

## 2022-05-14 LAB — URINALYSIS, ROUTINE W REFLEX MICROSCOPIC
Bilirubin Urine: NEGATIVE
Glucose, UA: NEGATIVE mg/dL
Ketones, ur: NEGATIVE mg/dL
Nitrite: POSITIVE — AB
Protein, ur: 30 mg/dL — AB
Specific Gravity, Urine: 1.014 (ref 1.005–1.030)
pH: 5 (ref 5.0–8.0)

## 2022-05-14 LAB — CBC
HCT: 35.3 % — ABNORMAL LOW (ref 36.0–46.0)
Hemoglobin: 11.4 g/dL — ABNORMAL LOW (ref 12.0–15.0)
MCH: 30.6 pg (ref 26.0–34.0)
MCHC: 32.3 g/dL (ref 30.0–36.0)
MCV: 94.9 fL (ref 80.0–100.0)
Platelets: 119 10*3/uL — ABNORMAL LOW (ref 150–400)
RBC: 3.72 MIL/uL — ABNORMAL LOW (ref 3.87–5.11)
RDW: 13.5 % (ref 11.5–15.5)
WBC: 8.4 10*3/uL (ref 4.0–10.5)
nRBC: 0 % (ref 0.0–0.2)

## 2022-05-14 LAB — LIPASE, BLOOD: Lipase: 30 U/L (ref 11–51)

## 2022-05-14 LAB — SARS CORONAVIRUS 2 BY RT PCR: SARS Coronavirus 2 by RT PCR: NEGATIVE

## 2022-05-14 LAB — LACTIC ACID, PLASMA: Lactic Acid, Venous: 1.6 mmol/L (ref 0.5–1.9)

## 2022-05-14 LAB — GLUCOSE, CAPILLARY: Glucose-Capillary: 102 mg/dL — ABNORMAL HIGH (ref 70–99)

## 2022-05-14 LAB — CREATININE, SERUM
Creatinine, Ser: 1.89 mg/dL — ABNORMAL HIGH (ref 0.44–1.00)
GFR, Estimated: 26 mL/min — ABNORMAL LOW (ref >60)

## 2022-05-14 MED ORDER — PANTOPRAZOLE SODIUM 40 MG PO TBEC
40.0000 mg | DELAYED_RELEASE_TABLET | Freq: Every day | ORAL | Status: DC
  Administered 2022-05-14 – 2022-05-18 (×5): 40 mg via ORAL
  Filled 2022-05-14 (×5): qty 1

## 2022-05-14 MED ORDER — ENOXAPARIN SODIUM 30 MG/0.3ML IJ SOSY
30.0000 mg | PREFILLED_SYRINGE | INTRAMUSCULAR | Status: DC
  Administered 2022-05-14 – 2022-05-17 (×4): 30 mg via SUBCUTANEOUS
  Filled 2022-05-14 (×4): qty 0.3

## 2022-05-14 MED ORDER — ONDANSETRON HCL 4 MG/2ML IJ SOLN
4.0000 mg | Freq: Once | INTRAMUSCULAR | Status: AC
  Administered 2022-05-14: 4 mg via INTRAVENOUS
  Filled 2022-05-14: qty 2

## 2022-05-14 MED ORDER — VANCOMYCIN HCL IN DEXTROSE 1-5 GM/200ML-% IV SOLN: 1000.0000 mg | Freq: Once | INTRAVENOUS | Status: DC

## 2022-05-14 MED ORDER — HYDROCODONE-ACETAMINOPHEN 5-325 MG PO TABS: 1.0000 | ORAL_TABLET | Freq: Four times a day (QID) | ORAL | Status: DC | PRN

## 2022-05-14 MED ORDER — SODIUM CHLORIDE 0.9 % IV BOLUS
1000.0000 mL | Freq: Once | INTRAVENOUS | Status: AC
  Administered 2022-05-14: 1000 mL via INTRAVENOUS

## 2022-05-14 MED ORDER — OXYCODONE HCL 5 MG PO TABS
5.0000 mg | ORAL_TABLET | ORAL | Status: DC | PRN
  Administered 2022-05-14 – 2022-05-18 (×13): 5 mg via ORAL
  Filled 2022-05-14 (×13): qty 1

## 2022-05-14 MED ORDER — POTASSIUM CHLORIDE IN NACL 20-0.9 MEQ/L-% IV SOLN: INTRAVENOUS | Status: DC

## 2022-05-14 MED ORDER — ONDANSETRON HCL 4 MG/2ML IJ SOLN
4.0000 mg | Freq: Four times a day (QID) | INTRAMUSCULAR | Status: DC | PRN
  Administered 2022-05-15 – 2022-05-18 (×7): 4 mg via INTRAVENOUS
  Filled 2022-05-14 (×7): qty 2

## 2022-05-14 MED ORDER — ACETAMINOPHEN 500 MG PO TABS
1000.0000 mg | ORAL_TABLET | Freq: Once | ORAL | Status: AC
  Administered 2022-05-14: 1000 mg via ORAL
  Filled 2022-05-14: qty 2

## 2022-05-14 MED ORDER — SODIUM CHLORIDE 0.9 % IV SOLN
2.0000 g | Freq: Once | INTRAVENOUS | Status: AC
  Administered 2022-05-14: 2 g via INTRAVENOUS
  Filled 2022-05-14: qty 12.5

## 2022-05-14 MED ORDER — SODIUM CHLORIDE 0.9 % IV SOLN
2.0000 g | INTRAVENOUS | Status: DC
  Administered 2022-05-15 – 2022-05-18 (×4): 2 g via INTRAVENOUS
  Filled 2022-05-14 (×4): qty 20

## 2022-05-14 MED ORDER — METRONIDAZOLE 500 MG/100ML IV SOLN
500.0000 mg | Freq: Once | INTRAVENOUS | Status: AC
  Administered 2022-05-14: 500 mg via INTRAVENOUS
  Filled 2022-05-14: qty 100

## 2022-05-14 MED ORDER — PRAVASTATIN SODIUM 40 MG PO TABS
80.0000 mg | ORAL_TABLET | Freq: Every day | ORAL | Status: DC
  Administered 2022-05-14 – 2022-05-18 (×5): 80 mg via ORAL
  Filled 2022-05-14 (×5): qty 2

## 2022-05-14 MED ORDER — SODIUM CHLORIDE 0.9 % IV SOLN
2.0000 g | INTRAVENOUS | Status: DC
  Filled 2022-05-14: qty 12.5

## 2022-05-14 MED ORDER — LATANOPROST 0.005 % OP SOLN
1.0000 [drp] | Freq: Every day | OPHTHALMIC | Status: DC
  Administered 2022-05-14 – 2022-05-17 (×4): 1 [drp] via OPHTHALMIC
  Filled 2022-05-14 (×2): qty 2.5

## 2022-05-14 MED ORDER — LACTATED RINGERS IV BOLUS (SEPSIS)
1000.0000 mL | Freq: Once | INTRAVENOUS | Status: AC
  Administered 2022-05-14: 1000 mL via INTRAVENOUS

## 2022-05-14 MED ORDER — VANCOMYCIN HCL IN DEXTROSE 1-5 GM/200ML-% IV SOLN: 1000.0000 mg | INTRAVENOUS | Status: DC

## 2022-05-14 MED ORDER — ALPRAZOLAM 0.5 MG PO TABS
0.5000 mg | ORAL_TABLET | Freq: Every day | ORAL | Status: DC | PRN
  Administered 2022-05-15 – 2022-05-17 (×3): 0.5 mg via ORAL
  Filled 2022-05-14 (×4): qty 1

## 2022-05-14 MED ORDER — ATROPINE SULFATE 1 % OP SOLN
1.0000 [drp] | Freq: Two times a day (BID) | OPHTHALMIC | Status: DC
  Administered 2022-05-14 – 2022-05-16 (×5): 1 [drp] via OPHTHALMIC
  Filled 2022-05-14 (×2): qty 5

## 2022-05-14 MED ORDER — VANCOMYCIN HCL 1500 MG/300ML IV SOLN
1500.0000 mg | Freq: Once | INTRAVENOUS | Status: AC
  Administered 2022-05-14: 1500 mg via INTRAVENOUS
  Filled 2022-05-14: qty 300

## 2022-05-14 MED ORDER — ACETAMINOPHEN 325 MG PO TABS
650.0000 mg | ORAL_TABLET | Freq: Four times a day (QID) | ORAL | Status: DC | PRN
  Administered 2022-05-14 – 2022-05-18 (×7): 650 mg via ORAL
  Filled 2022-05-14 (×7): qty 2

## 2022-05-14 MED ORDER — SERTRALINE HCL 50 MG PO TABS
100.0000 mg | ORAL_TABLET | Freq: Every day | ORAL | Status: DC
  Administered 2022-05-15 – 2022-05-18 (×4): 100 mg via ORAL
  Filled 2022-05-14 (×4): qty 2

## 2022-05-14 MED ORDER — LEVOTHYROXINE SODIUM 88 MCG PO TABS
88.0000 ug | ORAL_TABLET | Freq: Every day | ORAL | Status: DC
  Administered 2022-05-15 – 2022-05-18 (×4): 88 ug via ORAL
  Filled 2022-05-14 (×4): qty 1

## 2022-05-14 MED ORDER — SODIUM CHLORIDE 0.9 % IV BOLUS
500.0000 mL | Freq: Once | INTRAVENOUS | Status: AC
  Administered 2022-05-14: 500 mL via INTRAVENOUS

## 2022-05-14 NOTE — H&P (Signed)
History and Physical    Patient: Aimee Ward ZOX:[id number] DOB: July 15, 1933 DOA: 05/14/2022 DOS: the patient was seen and examined on 05/14/2022 PCP: April Manson, NP  Patient coming from: Home  Chief Complaint:  Chief Complaint  Patient presents with   Weakness   HPI: Aimee Ward is a 89 y.o. female with medical history significant of hypertension, heart failure with preserved EF (grade 1 diastolic dysfunction), GERD, hypothyroidism, macular degeneration with significant vision loss, degenerative disc disease.  Patient was recently treated for UTI at the beginning of April.  She was treated with oral antibiotics and discharged to home from the emergency department.  She followed up with her primary care provider who encouraged her to continue her antibiotics.  She states that she never fully recovered, but began to get worse again.  She began vomiting on Wednesday and has had not been able to keep much down since that time.  Oral food and liquid exacerbate her symptoms.  She has had fevers and bodyaches.  She denies flank pain.  Review of Systems: As mentioned in the history of present illness. All other systems reviewed and are negative. Past Medical History:  Diagnosis Date   Anemia    Anxiety    Aortic stenosis, moderate    CHF (congestive heart failure) (HCC) 2006   DDD (degenerative disc disease), lumbar    Full dentures    upper   GERD (gastroesophageal reflux disease)    Hearing loss of both ears    wears hearing aides   Hepatitis 1964   HTN (hypertension), benign    sees Dr Peter [place] annually to check heart valve   Hypothyroidism    Macular degeneration, bilateral    Migraine    Mixed hyperlipidemia    Murmur    Osteoarthritis    PNA (pneumonia) 2006   PONV (postoperative nausea and vomiting)    Syncope    UTI (lower urinary tract infection)    Past Surgical History:  Procedure Laterality Date   BACK SURGERY  2004   CATARACT EXTRACTION      COLONOSCOPY W/ POLYPECTOMY  2016   EYE SURGERY     bilateral detatched retina   FOOT SURGERY     LUMBAR FUSION  2016   THYROID SURGERY     Social History:  reports that she has never smoked. She has never used smokeless tobacco. She reports that she does not drink alcohol and does not use drugs.  Allergies  Allergen Reactions   Lisinopril Itching    itching Other   Sulfa Antibiotics Anaphylaxis   Ciprofloxacin Hcl     Other reaction(s): Other Muscle pain   Nitrofurantoin Itching    Other Other   Hydrochlorothiazide Other (See Comments)    Cramps  Cramps Cramps Other    Family History  Problem Relation Age of Onset   Hypertension Mother    Stroke Mother    Diabetes Father    Hypertension Brother    Rheumatic fever Brother    Diabetes Other    Hypertension Other    Stroke Other    Arthritis Other    Migraines Other     Prior to Admission medications   Medication Sig Start Date End Date Taking? Authorizing Provider  ALPRAZolam Prudy Feeler) 0.5 MG tablet Take 0.5 mg by mouth daily as needed for anxiety or sleep. Take 1/2 to 1 tablet daily as needed   Yes [provider]  atropine 1 % ophthalmic solution INSTILL 1 DROP  INTO LEFT EYE TWICE A DAY Patient taking differently: Place 1 drop into the left eye 2 (two) times daily. 06/19/21  Yes Rankin, Alford Highland, MD  dimenhyDRINATE (DRAMAMINE) 50 MG tablet Take 50 mg by mouth every 8 (eight) hours as needed for nausea.   Yes [provider]  ESTRACE VAGINAL 0.1 MG/GM vaginal cream Apply 1 application topically every other day.  07/29/14  Yes [provider]  HYDROcodone-acetaminophen (NORCO/VICODIN) 5-325 MG tablet Take 1 tablet by mouth every 6 (six) hours as needed. 09/26/14  Yes [provider]  hydrocortisone 2.5 % cream Apply 1 application topically 2 (two) times daily as needed. 01/29/18  Yes [provider]  latanoprost (XALATAN) 0.005 % ophthalmic solution Place 1 drop into the right eye  at bedtime.   Yes [provider]  levothyroxine (SYNTHROID, LEVOTHROID) 88 MCG tablet Take 88 mcg by mouth daily before breakfast.   Yes [provider]  lidocaine (XYLOCAINE) 2 % solution Take 5 mLs by mouth 4 (four) times daily as needed. 07/06/17  Yes [provider]  methocarbamol (ROBAXIN) 750 MG tablet Take 750 mg by mouth every 6 (six) hours as needed for muscle spasms.   Yes [provider]  metoprolol succinate (TOPROL-XL) 50 MG 24 hr tablet Take 50 mg by mouth daily. Take with or immediately following a meal.   Yes [provider]  Multiple Vitamins-Minerals (PRESERVISION AREDS) CAPS Take 1 capsule by mouth daily.   Yes [provider]  pantoprazole (PROTONIX) 40 MG tablet Take 40 mg by mouth daily. 02/06/19  Yes [provider]  pravastatin (PRAVACHOL) 80 MG tablet Take 80 mg by mouth daily.   Yes [provider]  sertraline (ZOLOFT) 100 MG tablet Take 100 mg by mouth daily. 01/18/18  Yes [provider]  acetic acid-hydrocortisone (VOSOL-HC) OTIC solution PLEASE SEE ATTACHED FOR DETAILED DIRECTIONS 02/28/20   [provider]  cephALEXin (KEFLEX) 500 MG capsule Take 1 capsule (500 mg total) by mouth 2 (two) times daily. Patient not taking: Reported on 05/14/2022 04/12/22   Smitty Knudsen, PA-C  cholecalciferol (VITAMIN D3) 25 MCG (1000 UT) tablet Take 1,000 Units by mouth daily.    [provider]  loratadine (CLARITIN) 10 MG tablet Take 10 mg by mouth daily.    [provider]  meloxicam (MOBIC) 15 MG tablet Take one tablet daily for 7-14 days then as needed for inflammation Patient not taking: Reported on 05/14/2022 05/02/22   [provider]  vitamin C (ASCORBIC ACID) 500 MG tablet Take 500 mg by mouth daily.    [provider]    Physical Exam: Vitals:   05/14/22 1118 05/14/22 1130 05/14/22 1246 05/14/22 1330  BP: 128/66 127/63  138/71  Pulse: 99 98  96  Resp:   (!) 23  (!) 22  Temp:   99.5 F (37.5 C)   TempSrc:   Oral   SpO2: 93% 94%  95%   General: Elderly female. Awake and alert and oriented x3. No acute cardiopulmonary distress.  HEENT: Normocephalic atraumatic.  Right and left ears normal in appearance.  Pupils equal, round, reactive to light. Extraocular muscles are intact. Sclerae anicteric and noninjected.  Moist mucosal membranes. No mucosal lesions.  Neck: Neck supple without lymphadenopathy. No carotid bruits. No masses palpated.  Cardiovascular: Regular rate with normal S1-S2 sounds. No murmurs, rubs, gallops auscultated. No JVD.  Respiratory: Good respiratory effort with no wheezes, rales, rhonchi. Lungs clear to auscultation bilaterally.  No accessory  muscle use. Abdomen: Soft, nontender, nondistended. Active bowel sounds. No masses or hepatosplenomegaly.  No CVA tenderness. Skin: No rashes, lesions, or ulcerations.  Dry, warm to touch. 2+ dorsalis pedis and radial pulses. Musculoskeletal: No calf or leg pain. All major joints not erythematous nontender.  No upper or lower joint deformation.  Good ROM.  No contractures  Psychiatric: Intact judgment and insight. Pleasant and cooperative. Neurologic: No focal neurological deficits. Strength is 5/5 and symmetric in upper and lower extremities.  Cranial nerves II through XII are grossly intact.  Data Reviewed: Results for orders placed or performed during the hospital encounter of 05/14/22 (from the past 24 hour(s))  SARS Coronavirus 2 by RT PCR (hospital order, performed in [facility] hospital lab) *cepheid single result test* Anterior Nasal Swab     Status: None   Collection Time: 05/14/22 11:38 AM   Specimen: Anterior Nasal Swab  Result Value Ref Range   SARS Coronavirus 2 by RT PCR NEGATIVE NEGATIVE  Culture, blood (single)     Status: None (Preliminary result)   Collection Time: 05/14/22 11:43 AM   Specimen: BLOOD RIGHT ARM  Result Value Ref Range   Specimen Description       BLOOD RIGHT ARM BOTTLES DRAWN AEROBIC AND ANAEROBIC   Special Requests      Blood Culture results may not be optimal due to an excessive volume of blood received in culture bottles Performed at [facility], [address], [place], [place] [address]    Culture PENDING    Report Status PENDING   Creatinine, serum     Status: Abnormal   Collection Time: 05/14/22 12:24 PM  Result Value Ref Range   Creatinine, Ser 1.89 (H) 0.44 - 1.00 mg/dL   GFR, Estimated 26 (L) >60 mL/min  CBC     Status: Abnormal   Collection Time: 05/14/22 12:40 PM  Result Value Ref Range   WBC 8.4 4.0 - 10.5 K/uL   RBC 3.72 (L) 3.87 - 5.11 MIL/uL   Hemoglobin 11.4 (L) 12.0 - 15.0 g/dL   HCT 60.4 (L) 54.0 - 98.1 %   MCV 94.9 80.0 - 100.0 fL   MCH 30.6 26.0 - 34.0 pg   MCHC 32.3 30.0 - 36.0 g/dL   RDW 19.1 47.8 - 29.5 %   Platelets 119 (L) 150 - 400 K/uL   nRBC 0.0 0.0 - 0.2 %  Comprehensive metabolic panel     Status: Abnormal   Collection Time: 05/14/22 12:40 PM  Result Value Ref Range   Sodium 128 (L) 135 - 145 mmol/L   Potassium 4.3 3.5 - 5.1 mmol/L   Chloride 100 98 - 111 mmol/L   CO2 18 (L) 22 - 32 mmol/L   Glucose, Bld 113 (H) 70 - 99 mg/dL   BUN 41 (H) 8 - 23 mg/dL   Creatinine, Ser 6.21 (H) 0.44 - 1.00 mg/dL   Calcium 8.1 (L) 8.9 - 10.3 mg/dL   Total Protein 7.5 6.5 - 8.1 g/dL   Albumin 3.1 (L) 3.5 - 5.0 g/dL   AST 18 15 - 41 U/L   ALT 11 0 - 44 U/L   Alkaline Phosphatase 67 38 - 126 U/L   Total Bilirubin 1.0 0.3 - 1.2 mg/dL   GFR, Estimated 25 (L) >60 mL/min   Anion gap 10 5 - 15  Lipase, blood     Status: None   Collection Time: 05/14/22 12:40 PM  Result Value Ref Range   Lipase 30 11 - 51 U/L  Lactic acid, plasma     Status: None   Collection Time: 05/14/22 12:40 PM  Result Value Ref Range   Lactic Acid, Venous 1.6 0.5 - 1.9 mmol/L  Urinalysis, Routine w reflex microscopic -Urine, Clean Catch     Status: Abnormal   Collection Time: 05/14/22  2:51 PM  Result Value Ref Range   Color,  Urine YELLOW YELLOW   APPearance HAZY (A) CLEAR   Specific Gravity, Urine 1.014 1.005 - 1.030   pH 5.0 5.0 - 8.0   Glucose, UA NEGATIVE NEGATIVE mg/dL   Hgb urine dipstick SMALL (A) NEGATIVE   Bilirubin Urine NEGATIVE NEGATIVE   Ketones, ur NEGATIVE NEGATIVE mg/dL   Protein, ur 30 (A) NEGATIVE mg/dL   Nitrite POSITIVE (A) NEGATIVE   Leukocytes,Ua SMALL (A) NEGATIVE   RBC / HPF 6-10 0 - 5 RBC/hpf   WBC, UA 21-50 0 - 5 WBC/hpf   Bacteria, UA MANY (A) NONE SEEN   Squamous Epithelial / HPF 0-5 0 - 5 /HPF   Amorphous Crystal PRESENT     CT ABDOMEN PELVIS WO CONTRAST  Result Date: 05/14/2022 CLINICAL DATA:  Abdominal pain, acute, nonlocalized EXAM: CT ABDOMEN AND PELVIS WITHOUT CONTRAST TECHNIQUE: Multidetector CT imaging of the abdomen and pelvis was performed following the standard protocol without IV contrast. RADIATION DOSE REDUCTION: This exam was performed according to the departmental dose-optimization program which includes automated exposure control, adjustment of the mA and/or kV according to patient size and/or use of iterative reconstruction technique. COMPARISON:  December 28, 2018 FINDINGS: Evaluation is limited by lack of IV contrast and motion. Lower chest: No acute abnormality. Hepatobiliary: The gallbladder is distended. Cholelithiasis. No new extrahepatic biliary ductal dilation. No new focal hepatic lesion. There is a small amount of fluid along the inferior aspect of the gallbladder which lays immediately adjacent to the RIGHT kidney. Pancreas: No peripancreatic fat stranding. Spleen: Unremarkable. Adrenals/Urinary Tract: Adrenal glands are unremarkable. No hydronephrosis or obstructing nephrolithiasis. Bladder is unremarkable. There is new fat stranding adjacent to the RIGHT kidney with a small amount of adjacent fluid in the pericolic gutter and retroperitoneal space. Stomach/Bowel: No evidence of bowel obstruction. Severe diverticulosis most pronounced in the sigmoid and  descending colon. No evidence of acute diverticulitis. Appendix is normal. Stomach is decompressed. Vascular/Lymphatic: Atherosclerotic calcifications of the nonaneurysmal abdominal aorta. No new lymphadenopathy is identified. Reproductive: Uterus and bilateral adnexa are unremarkable. Other: Small fat containing periumbilical hernia. Musculoskeletal: Status post posterior fixation of the lower lumbar spine. IMPRESSION: 1. There is new fat stranding in the RIGHT upper quadrant. It is centered predominantly adjacent to the RIGHT kidney with trace fluid tracking in the RIGHT pericolic gutter along the gallbladder inferiorly as well as posterior to the RIGHT kidney. However, gallbladder is also distended with multiple layering cholelithiasis and lays immediately adjacent to the RIGHT kidney. Constellation of findings are favored to reflect sequela of urinary tract infection such as pyelonephritis but differential considerations include early acute cholecystitis. Recommend correlation with physical exam and urinalysis. Aortic Atherosclerosis (ICD10-I70.0). Electronically Signed   By: Meda Klinefelter M.D.   On: 05/14/2022 15:20   DG Chest Port 1 View  Result Date: 05/14/2022 CLINICAL DATA:  Generalized weakness.  Vomiting. EXAM: PORTABLE CHEST 1 VIEW COMPARISON:  11/24/2004. FINDINGS: Cardiac silhouette is normal in size. No mediastinal or hilar masses. Lungs are clear.  No pleural effusion or pneumothorax. Skeletal structures are demineralized, grossly intact. IMPRESSION: No active disease. Electronically Signed   By: Renard Hamper.D.  On: 05/14/2022 14:56     Assessment and Plan: No notes have been filed under this hospital service. Service: Hospitalist  Principal Problem:   Sepsis (HCC) Active Problems:   Aortic stenosis, moderate   HTN (hypertension), benign   Acute pyelonephritis   AKI (acute kidney injury) (HCC)   Hypothyroidism   Macular degeneration, bilateral   CHF (congestive heart  failure) (HCC)   Hyponatremia   Dehydration  Sepsis Broad-spectrum antibiotics were initiated by the EDP along with blood cultures and urine cultures At this point because we have a source, we can narrow the antibiotics.  I did review her last urine culture, which showed E. coli sensitive to cephalosporins Will narrow antibiotics to just Rocephin UTI with radiologic evidence of pyelonephritis Rocephin Repeat CBC in the morning Urine culture pending Hyponatremia secondary to dehydration, metabolic acidosis secondary to infection Hypovolemic hyponatremia IV fluids for sodium replacement and rehydration AKI  Recheck creatinine in the morning after fluid hydration hypertension, HFrEF Will hold antihypertensives for the moment Hypothyroidism Continue levothyroxine Macular degeneration   Advance Care Planning:   Code Status: Prior DNR confirmed by patient  Consults: none  Family Communication: daughter present  Severity of Illness: The appropriate patient status for this patient is INPATIENT. Inpatient status is judged to be reasonable and necessary in order to provide the required intensity of service to ensure the patient's safety. The patient's presenting symptoms, physical exam findings, and initial radiographic and laboratory data in the context of their chronic comorbidities is felt to place them at high risk for further clinical deterioration. Furthermore, it is not anticipated that the patient will be medically stable for discharge from the hospital within 2 midnights of admission.   * I certify that at the point of admission it is my clinical judgment that the patient will require inpatient hospital care spanning beyond 2 midnights from the point of admission due to high intensity of service, high risk for further deterioration and high frequency of surveillance required.*  Author: Levie Heritage, DO 05/14/2022 4:16 PM  For on call review [email or web address].

## 2022-05-14 NOTE — ED Triage Notes (Signed)
Pt bib [facility] d/t generalized weakness since Wednesday, vomited a couple times Wednesday night, Thursday morning. States she doesn't feel any better she she decided to call 911 to be checked out. Stated she fell out of bed Wednesday night. Does currently have a temp of  101.2.

## 2022-05-14 NOTE — ED Provider Notes (Signed)
[place] EMERGENCY DEPARTMENT AT [facility] Provider Note   CSN: [id number] Arrival date & time: 05/14/22  1109     History  Chief Complaint  Patient presents with   Weakness    Aimee Ward is a 89 y.o. female.  Patient with c/o generalized body aches, general weakness, and nausea/vomiting in past three days. At baseline, 'almost blind', can see shapes with right eye - not sure of color of emesis. Vomited a few days. No abdominal pain or distension. No diarrhea or constipation. Denies known ill contacts or bad food ingestion. +non prod cough. No sore throat or runny nose. No headache. No neck pain/stiffness. Denies skin changes, rash or lesions. No dysuria or gu c/o, although indicates had uti a month ago. Unaware of fevers at home, temp on arrival 101.   The history is provided by the patient, medical records, a relative and the EMS personnel.  Weakness Associated symptoms: cough, fever, myalgias, nausea and vomiting   Associated symptoms: no abdominal pain, no chest pain, no diarrhea, no dysuria, no headaches and no shortness of breath        Home Medications Prior to Admission medications   Medication Sig Start Date End Date Taking? Authorizing Provider  acetic acid-hydrocortisone (VOSOL-HC) OTIC solution PLEASE SEE ATTACHED FOR DETAILED DIRECTIONS 02/28/20   [provider]  ALPRAZolam Prudy Feeler) 0.5 MG tablet Take 0.25 mg by mouth at bedtime as needed for anxiety or sleep.     [provider]  atropine 1 % ophthalmic solution INSTILL 1 DROP INTO LEFT EYE TWICE A DAY 06/19/21   Rankin, Alford Highland, MD  cephALEXin (KEFLEX) 500 MG capsule Take 1 capsule (500 mg total) by mouth 2 (two) times daily. 04/12/22   Smitty Knudsen, PA-C  cholecalciferol (VITAMIN D3) 25 MCG (1000 UT) tablet Take 1,000 Units by mouth daily.    [provider]  ESTRACE VAGINAL 0.1 MG/GM vaginal cream Apply 1 application topically every other day.  07/29/14   [provider]  fluconazole (DIFLUCAN) 200 MG tablet Take by mouth.    [provider]  gabapentin (NEURONTIN) 100 MG capsule Take 100 mg by mouth 3 (three) times daily. 01/18/18   [provider]  HYDROcodone-acetaminophen (NORCO/VICODIN) 5-325 MG tablet Take 1 tablet by mouth every 6 (six) hours as needed. 09/26/14   [provider]  hydrocortisone 2.5 % cream Apply 1 application topically 2 (two) times daily as needed. 01/29/18   [provider]  levothyroxine (SYNTHROID, LEVOTHROID) 88 MCG tablet Take 88 mcg by mouth daily before breakfast.    [provider]  lidocaine (XYLOCAINE) 2 % solution Take 5 mLs by mouth 4 (four) times daily as needed. 07/06/17   [provider]  loratadine (CLARITIN) 10 MG tablet Take 10 mg by mouth daily.    [provider]  methocarbamol (ROBAXIN) 750 MG tablet Take 750 mg by mouth every 6 (six) hours as needed for muscle spasms.    [provider]  metoprolol succinate (TOPROL-XL) 50 MG 24 hr tablet Take 50 mg by mouth daily. Take with or immediately following a meal.    [provider]  Multiple Vitamins-Minerals (PRESERVISION AREDS) CAPS Take 1 capsule by mouth daily. Take 1 tab twice a day    [provider]  pantoprazole (PROTONIX) 40 MG tablet Take by mouth. 02/06/19   [provider]  pravastatin (PRAVACHOL) 80 MG tablet Take 80 mg by mouth daily.    [provider]  prednisoLONE acetate (PRED FORTE) 1 % ophthalmic suspension INSTILL 1 DROP INTO LEFT EYE 1 TIME A DAY 06/15/21 06/16/22  Rankin, Alford Highland, MD  promethazine (PHENERGAN) 25 MG suppository Place rectally. 01/21/19   [provider]  sertraline (ZOLOFT) 100 MG tablet Take 100 mg by mouth daily. 01/18/18   [provider]  vitamin C (ASCORBIC ACID) 500 MG tablet Take 500 mg by mouth daily.    [provider]      Allergies    Lisinopril, Sulfa antibiotics, Ciprofloxacin hcl,  Nitrofurantoin, and Hydrochlorothiazide    Review of Systems   Review of Systems  Constitutional:  Positive for fever. Negative for chills.  HENT:  Negative for sore throat.   Eyes:  Negative for pain and redness.  Respiratory:  Positive for cough. Negative for shortness of breath.   Cardiovascular:  Negative for chest pain and leg swelling.  Gastrointestinal:  Positive for nausea and vomiting. Negative for abdominal pain and diarrhea.  Genitourinary:  Negative for dysuria and flank pain.  Musculoskeletal:  Positive for myalgias. Negative for neck pain and neck stiffness.  Skin:  Negative for rash.  Neurological:  Positive for weakness. Negative for speech difficulty, numbness and headaches.  Hematological:  Does not bruise/bleed easily.  Psychiatric/Behavioral:  Negative for confusion.     Physical Exam Updated Vital Signs BP 138/71   Pulse 96   Temp 99.5 F (37.5 C) (Oral)   Resp (!) 22   SpO2 95%  Physical Exam Vitals and nursing note reviewed.  Constitutional:      Appearance: Normal appearance. She is well-developed.  HENT:     Head: Atraumatic.     Nose: Nose normal.     Mouth/Throat:     Mouth: Mucous membranes are moist.  Eyes:     General: No scleral icterus.    Conjunctiva/sclera: Conjunctivae normal.     Comments: Pupils irregular (prior surgery).   Neck:     Vascular: No carotid bruit.     Trachea: No tracheal deviation.     Comments: No stiffness or rigidity.  Cardiovascular:     Rate and Rhythm: Normal rate and regular rhythm.     Pulses: Normal pulses.     Heart sounds: Normal heart sounds. No murmur heard.    No friction rub. No gallop.  Pulmonary:     Effort: Pulmonary effort is normal. No respiratory distress.     Breath sounds: Normal breath sounds.  Abdominal:     General: Bowel sounds are normal. There is no distension.     Palpations: Abdomen is soft.     Tenderness: There is no abdominal tenderness. There is no guarding.  Genitourinary:     Comments: No cva tenderness.  Musculoskeletal:        General: No swelling.     Cervical back: Normal range of motion and neck supple. No rigidity. No muscular tenderness.     Comments: No focal spine or extremity pain or bony tenderness. No swelling.   Skin:    General: Skin is warm and dry.     Findings: No rash.  Neurological:     Mental Status: She is alert.     Comments: Alert, speech normal. Motor/sens grossly intact bil.   Psychiatric:        Mood and Affect: Mood normal.     ED Results / Procedures / Treatments   Labs (all labs ordered are listed, but only abnormal results are displayed) Results for orders placed or performed  during the hospital encounter of 05/14/22  Culture, blood (single)   Specimen: BLOOD RIGHT ARM  Result Value Ref Range   Specimen Description      BLOOD RIGHT ARM BOTTLES DRAWN AEROBIC AND ANAEROBIC   Special Requests      Blood Culture results may not be optimal due to an excessive volume of blood received in culture bottles Performed at [facility], [address] [place], [place] [address]    Culture PENDING    Report Status PENDING   SARS Coronavirus 2 by RT PCR (hospital order, performed in [facility] hospital lab) *cepheid single result test* Anterior Nasal Swab   Specimen: Anterior Nasal Swab  Result Value Ref Range   SARS Coronavirus 2 by RT PCR NEGATIVE NEGATIVE  Creatinine, serum  Result Value Ref Range   Creatinine, Ser 1.89 (H) 0.44 - 1.00 mg/dL   GFR, Estimated 26 (L) >60 mL/min  CBC  Result Value Ref Range   WBC 8.4 4.0 - 10.5 K/uL   RBC 3.72 (L) 3.87 - 5.11 MIL/uL   Hemoglobin 11.4 (L) 12.0 - 15.0 g/dL   HCT 60.4 (L) 54.0 - 98.1 %   MCV 94.9 80.0 - 100.0 fL   MCH 30.6 26.0 - 34.0 pg   MCHC 32.3 30.0 - 36.0 g/dL   RDW 19.1 47.8 - 29.5 %   Platelets 119 (L) 150 - 400 K/uL   nRBC 0.0 0.0 - 0.2 %  Comprehensive metabolic panel  Result Value Ref Range   Sodium 128 (L) 135 - 145 mmol/L   Potassium 4.3 3.5 - 5.1 mmol/L    Chloride 100 98 - 111 mmol/L   CO2 18 (L) 22 - 32 mmol/L   Glucose, Bld 113 (H) 70 - 99 mg/dL   BUN 41 (H) 8 - 23 mg/dL   Creatinine, Ser 6.21 (H) 0.44 - 1.00 mg/dL   Calcium 8.1 (L) 8.9 - 10.3 mg/dL   Total Protein 7.5 6.5 - 8.1 g/dL   Albumin 3.1 (L) 3.5 - 5.0 g/dL   AST 18 15 - 41 U/L   ALT 11 0 - 44 U/L   Alkaline Phosphatase 67 38 - 126 U/L   Total Bilirubin 1.0 0.3 - 1.2 mg/dL   GFR, Estimated 25 (L) >60 mL/min   Anion gap 10 5 - 15  Urinalysis, Routine w reflex microscopic -Urine, Clean Catch  Result Value Ref Range   Color, Urine YELLOW YELLOW   APPearance HAZY (A) CLEAR   Specific Gravity, Urine 1.014 1.005 - 1.030   pH 5.0 5.0 - 8.0   Glucose, UA NEGATIVE NEGATIVE mg/dL   Hgb urine dipstick SMALL (A) NEGATIVE   Bilirubin Urine NEGATIVE NEGATIVE   Ketones, ur NEGATIVE NEGATIVE mg/dL   Protein, ur 30 (A) NEGATIVE mg/dL   Nitrite POSITIVE (A) NEGATIVE   Leukocytes,Ua SMALL (A) NEGATIVE   RBC / HPF 6-10 0 - 5 RBC/hpf   WBC, UA 21-50 0 - 5 WBC/hpf   Bacteria, UA MANY (A) NONE SEEN   Squamous Epithelial / HPF 0-5 0 - 5 /HPF   Amorphous Crystal PRESENT   Lipase, blood  Result Value Ref Range   Lipase 30 11 - 51 U/L  Lactic acid, plasma  Result Value Ref Range   Lactic Acid, Venous 1.6 0.5 - 1.9 mmol/L      EKG EKG Interpretation  Date/Time:  Saturday May 14 2022 11:21:24 EDT Ventricular Rate:  99 PR Interval:  146 QRS Duration: 73 QT Interval:  322 QTC  Calculation: 414 R Axis:   11 Text Interpretation: Sinus tachycardia Atrial premature complex Nonspecific ST abnormality Confirmed by Cathren Laine ([id number]) on 05/14/2022 11:34:25 AM  Radiology CT ABDOMEN PELVIS WO CONTRAST  Result Date: 05/14/2022 CLINICAL DATA:  Abdominal pain, acute, nonlocalized EXAM: CT ABDOMEN AND PELVIS WITHOUT CONTRAST TECHNIQUE: Multidetector CT imaging of the abdomen and pelvis was performed following the standard protocol without IV contrast. RADIATION DOSE REDUCTION: This exam was  performed according to the departmental dose-optimization program which includes automated exposure control, adjustment of the mA and/or kV according to patient size and/or use of iterative reconstruction technique. COMPARISON:  December 28, 2018 FINDINGS: Evaluation is limited by lack of IV contrast and motion. Lower chest: No acute abnormality. Hepatobiliary: The gallbladder is distended. Cholelithiasis. No new extrahepatic biliary ductal dilation. No new focal hepatic lesion. There is a small amount of fluid along the inferior aspect of the gallbladder which lays immediately adjacent to the RIGHT kidney. Pancreas: No peripancreatic fat stranding. Spleen: Unremarkable. Adrenals/Urinary Tract: Adrenal glands are unremarkable. No hydronephrosis or obstructing nephrolithiasis. Bladder is unremarkable. There is new fat stranding adjacent to the RIGHT kidney with a small amount of adjacent fluid in the pericolic gutter and retroperitoneal space. Stomach/Bowel: No evidence of bowel obstruction. Severe diverticulosis most pronounced in the sigmoid and descending colon. No evidence of acute diverticulitis. Appendix is normal. Stomach is decompressed. Vascular/Lymphatic: Atherosclerotic calcifications of the nonaneurysmal abdominal aorta. No new lymphadenopathy is identified. Reproductive: Uterus and bilateral adnexa are unremarkable. Other: Small fat containing periumbilical hernia. Musculoskeletal: Status post posterior fixation of the lower lumbar spine. IMPRESSION: 1. There is new fat stranding in the RIGHT upper quadrant. It is centered predominantly adjacent to the RIGHT kidney with trace fluid tracking in the RIGHT pericolic gutter along the gallbladder inferiorly as well as posterior to the RIGHT kidney. However, gallbladder is also distended with multiple layering cholelithiasis and lays immediately adjacent to the RIGHT kidney. Constellation of findings are favored to reflect sequela of urinary tract infection  such as pyelonephritis but differential considerations include early acute cholecystitis. Recommend correlation with physical exam and urinalysis. Aortic Atherosclerosis (ICD10-I70.0). Electronically Signed   By: Meda Klinefelter M.D.   On: 05/14/2022 15:20   DG Chest Port 1 View  Result Date: 05/14/2022 CLINICAL DATA:  Generalized weakness.  Vomiting. EXAM: PORTABLE CHEST 1 VIEW COMPARISON:  11/24/2004. FINDINGS: Cardiac silhouette is normal in size. No mediastinal or hilar masses. Lungs are clear.  No pleural effusion or pneumothorax. Skeletal structures are demineralized, grossly intact. IMPRESSION: No active disease. Electronically Signed   By: Amie Portland M.D.   On: 05/14/2022 14:56    Procedures Procedures    Medications Ordered in ED Medications  ceFEPIme (MAXIPIME) 2 g in sodium chloride 0.9 % 100 mL IVPB (has no administration in time range)  vancomycin (VANCOCIN) IVPB 1000 mg/200 mL premix (has no administration in time range)  lactated ringers bolus 1,000 mL (0 mLs Intravenous Stopped 05/14/22 1256)  ceFEPIme (MAXIPIME) 2 g in sodium chloride 0.9 % 100 mL IVPB (0 g Intravenous Stopped 05/14/22 1422)  metroNIDAZOLE (FLAGYL) IVPB 500 mg (0 mg Intravenous Stopped 05/14/22 1319)  vancomycin (VANCOREADY) IVPB 1500 mg/300 mL (0 mg Intravenous Stopped 05/14/22 1422)  acetaminophen (TYLENOL) tablet 1,000 mg (1,000 mg Oral Given 05/14/22 1319)  ondansetron (ZOFRAN) injection 4 mg (4 mg Intravenous Given 05/14/22 1509)  sodium chloride 0.9 % bolus 1,000 mL (1,000 mLs Intravenous New Bag/Given 05/14/22 1511)    ED Course/ Medical Decision Making/ A&P  Medical Decision Making Problems Addressed: Acute UTI: acute illness or injury with systemic symptoms that poses a threat to life or bodily functions AKI (acute kidney injury) (HCC): acute illness or injury with systemic symptoms that poses a threat to life or bodily functions Dehydration: acute illness or injury with  systemic symptoms that poses a threat to life or bodily functions Hyponatremia: acute illness or injury Thrombocytopenia (HCC): acute illness or injury  Amount and/or Complexity of Data Reviewed Independent Historian: EMS    Details: Ems/family, hx External Data Reviewed: notes. Labs: ordered. Decision-making details documented in ED Course. Radiology: ordered and independent interpretation performed. Decision-making details documented in ED Course. ECG/medicine tests: ordered and independent interpretation performed. Decision-making details documented in ED Course.  Risk OTC drugs. Prescription drug management. Decision regarding hospitalization.   Iv ns. Continuous pulse ox and cardiac monitoring. Labs ordered/sent. Imaging ordered.   Differential diagnosis includes uti, pna, sepsis, viral syndrome, etc. Dispo decision including potential need for admission considered - will get labs and imaging and reassess.   Reviewed nursing notes and prior charts for additional history. External reports reviewed. Additional history from: family/EMS.   Cultures sent. Iv antibiotics given.   Cardiac monitor: sinus rhythm, rate 94.  Labs reviewed/interpreted by me - ua c/w uti. U cx sent. Lactate normal. NA low.   Additional ivf.   Xrays reviewed/interpreted by me - no pna.   CT reviewed/interpreted by me - c/w pyelo.   Hospitalists consulted for admission.             Final Clinical Impression(s) / ED Diagnoses Final diagnoses:  None    Rx / DC Orders ED Discharge Orders     None         Cathren Laine, MD 05/14/22 [phone]

## 2022-05-14 NOTE — Progress Notes (Signed)
Attempted to call ED for report on patient, no answer.

## 2022-05-14 NOTE — Progress Notes (Signed)
Pharmacy Antibiotic Note  Aimee Ward is a 89 y.o. female admitted on 05/14/2022 with  unknown source of infection .  Pharmacy has been consulted for vancomycin and cefepime dosing.  Plan: Vancomycin 1500 mg IV x 1 dose. Vancomycin 1000 mg IV every 48 hours. Cefepime 2000 mg IV every 24 hours. Monitor labs, c/s, and vanco levels as indicated.     Temp (24hrs), Avg:100.4 F (38 C), Min:99.5 F (37.5 C), Max:101.2 F (38.4 C)  Recent Labs  Lab 05/14/22 1224  CREATININE 1.89*    CrCl cannot be calculated (Unknown ideal weight.).    Allergies  Allergen Reactions   Lisinopril Itching    itching Other   Sulfa Antibiotics Anaphylaxis   Ciprofloxacin Hcl     Other reaction(s): Other Muscle pain   Nitrofurantoin Itching    Other Other   Hydrochlorothiazide Other (See Comments)    Cramps  Cramps Cramps Other    Antimicrobials this admission: Vanco 5/4 >> Cefepime 5/4 >> Flagyl 5/4  Microbiology results: 5/4 BCx: pending   Thank you for allowing pharmacy to be a part of this patient's care.  Judeth Cornfield, PharmD Clinical Pharmacist 05/14/2022 1:12 PM

## 2022-05-15 DIAGNOSIS — E860 Dehydration: Secondary | ICD-10-CM | POA: Diagnosis not present

## 2022-05-15 DIAGNOSIS — N179 Acute kidney failure, unspecified: Secondary | ICD-10-CM

## 2022-05-15 DIAGNOSIS — N390 Urinary tract infection, site not specified: Secondary | ICD-10-CM | POA: Diagnosis not present

## 2022-05-15 LAB — CBC
HCT: 29.7 % — ABNORMAL LOW (ref 36.0–46.0)
Hemoglobin: 9.4 g/dL — ABNORMAL LOW (ref 12.0–15.0)
MCH: 30.7 pg (ref 26.0–34.0)
MCHC: 31.6 g/dL (ref 30.0–36.0)
MCV: 97.1 fL (ref 80.0–100.0)
Platelets: 108 10*3/uL — ABNORMAL LOW (ref 150–400)
RBC: 3.06 MIL/uL — ABNORMAL LOW (ref 3.87–5.11)
RDW: 13.6 % (ref 11.5–15.5)
WBC: 7.1 10*3/uL (ref 4.0–10.5)
nRBC: 0 % (ref 0.0–0.2)

## 2022-05-15 LAB — BASIC METABOLIC PANEL
Anion gap: 8 (ref 5–15)
BUN: 34 mg/dL — ABNORMAL HIGH (ref 8–23)
CO2: 16 mmol/L — ABNORMAL LOW (ref 22–32)
Calcium: 7.7 mg/dL — ABNORMAL LOW (ref 8.9–10.3)
Chloride: 107 mmol/L (ref 98–111)
Creatinine, Ser: 1.47 mg/dL — ABNORMAL HIGH (ref 0.44–1.00)
GFR, Estimated: 35 mL/min — ABNORMAL LOW (ref >60)
Glucose, Bld: 95 mg/dL (ref 70–99)
Potassium: 4.5 mmol/L (ref 3.5–5.1)
Sodium: 131 mmol/L — ABNORMAL LOW (ref 135–145)

## 2022-05-15 LAB — BLOOD CULTURE ID PANEL (REFLEXED) - BCID2

## 2022-05-15 NOTE — TOC Initial Note (Signed)
Transition of Care [facility]) - Initial/Assessment Note    Patient Details  Name: Aimee Ward MRN: [id number] Date of Birth: 02-Jun-1933  Transition of Care (TOC) CM/SW Contact:    Catalina Gravel, LCSW Phone Number: 05/15/2022, 12:08 PM  Clinical Narrative:                 Pt from home.  Arrived by EMS, due to weakness and recent fall out of bed. No TOC needs identified at this time.  TOC to follow.    Barriers to Discharge: Continued Medical Work up   Patient Goals and CMS Choice            Expected Discharge Plan and Services                                              Prior Living Arrangements/Services                       Activities of Daily Living   ADL Screening (condition at time of admission) Is the patient deaf or have difficulty hearing?: Yes Does the patient have difficulty seeing, even when wearing glasses/contacts?: Yes Does the patient have difficulty concentrating, remembering, or making decisions?: No Does the patient have difficulty dressing or bathing?: Yes Does the patient have difficulty walking or climbing stairs?: Yes  Permission Sought/Granted                  Emotional Assessment              Admission diagnosis:  Dehydration [E86.0] Hyponatremia [E87.1] Thrombocytopenia (HCC) [D69.6] Acute UTI [N39.0] AKI (acute kidney injury) (HCC) [N17.9] Sepsis (HCC) [A41.9] Patient Active Problem List   Diagnosis Date Noted   Sepsis (HCC) 05/14/2022   Acute pyelonephritis 05/14/2022   AKI (acute kidney injury) (HCC) 05/14/2022   Hypothyroidism 05/14/2022   Macular degeneration, bilateral 05/14/2022   Hyponatremia 05/14/2022   Dehydration 05/14/2022   Displacement of intraocular lens,, right 06/15/2021   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 involvement 03/03/2020   Exudative age-related macular degeneration of left eye with inactive choroidal neovascularization  (HCC) 03/03/2020   Narrow angle glaucoma of left eye 03/02/2020   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 [place] [address] Phone: [phone] Fax: [phone]     Social Determinants of Health (SDOH) Social History: SDOH Screenings   Tobacco Use: Low Risk  (05/14/2022)   SDOH Interventions:     Readmission Risk Interventions     No data to display

## 2022-05-15 NOTE — Progress Notes (Signed)
PROGRESS NOTE    Aimee Ward  JXB:[id number] DOB: [birth year] DOA: 05/14/2022 PCP: April Manson, NP    Brief Narrative:  Aimee Ward is a 89 y.o. female with medical history significant of hypertension, heart failure with preserved EF (grade 1 diastolic dysfunction), GERD, hypothyroidism, macular degeneration with significant vision loss, degenerative disc disease.  Patient was recently treated for UTI at the beginning of April.  She was treated with oral antibiotics and discharged to home from the emergency department.  She followed up with her primary care provider who encouraged her to continue her antibiotics.  She states that she never fully recovered, but began to get worse again.  She began vomiting on Wednesday and has had not been able to keep much down since that time.  Oral food and liquid exacerbate her symptoms.  She has had fevers and bodyaches.     Assessment and Plan: Sepsis due to UTI/pyelonephritis -Broad-spectrum antibiotics were initiated by the EDP along with blood cultures and urine cultures -narrow the antibiotics  -recently seen in ER and treated for presumed UTI with keflex - last urine culture (2020),  showed E. coli sensitive to cephalosporins--- narrow antibiotics to just Rocephin -check PVR  Hyponatremia secondary to dehydration, metabolic acidosis secondary to infection -improving  AKI  -trending down  hypertension, HFrEF - hold antihypertensives for the moment  Hypothyroidism -Continue levothyroxine    DVT prophylaxis: enoxaparin (LOVENOX) injection 30 mg Start: 05/14/22 1930    Code Status: DNR Family Communication: at bedside  Disposition Plan:  Level of care: Med-Surg Status is: Inpatient Remains inpatient appropriate because: needs IV abx    Consultants:  none    Subjective: Overall feeling better  Objective: Vitals:   05/14/22 1854 05/14/22 2300 05/15/22 0403 05/15/22 0914  BP: (!) 140/88 (!) 146/98 132/74 118/71   Pulse: 99  (!) 107 86  Resp: 20 18 20 18   Temp: 99.7 F (37.6 C) 98.7 F (37.1 C) 100.3 F (37.9 C) 98.1 F (36.7 C)  TempSrc: Oral Oral Oral Oral  SpO2: 98% 98% 95% 100%   No intake or output data in the 24 hours ending 05/15/22 1007 There were no vitals filed for this visit.  Examination:   General: Appearance:     Overweight female in no acute distress     Lungs:     respirations unlabored  Heart:    Normal heart rate.    MS:   All extremities are intact.    Neurologic:   Awake, alert       Data Reviewed: I have personally reviewed following labs and imaging studies  CBC: Recent Labs  Lab 05/14/22 1240 05/15/22 0445  WBC 8.4 7.1  HGB 11.4* 9.4*  HCT 35.3* 29.7*  MCV 94.9 97.1  PLT 119* 108*   Basic Metabolic Panel: Recent Labs  Lab 05/14/22 1224 05/14/22 1240 05/15/22 0445  NA  --  128* 131*  K  --  4.3 4.5  CL  --  100 107  CO2  --  18* 16*  GLUCOSE  --  113* 95  BUN  --  41* 34*  CREATININE 1.89* 1.92* 1.47*  CALCIUM  --  8.1* 7.7*   GFR: CrCl cannot be calculated (Unknown ideal weight.). Liver Function Tests: Recent Labs  Lab 05/14/22 1240  AST 18  ALT 11  ALKPHOS 67  BILITOT 1.0  PROT 7.5  ALBUMIN 3.1*   Recent Labs  Lab 05/14/22 1240  LIPASE 30   No results  for input(s): "AMMONIA" in the last 168 hours. Coagulation Profile: No results for input(s): "INR", "PROTIME" in the last 168 hours. Cardiac Enzymes: No results for input(s): "CKTOTAL", "CKMB", "CKMBINDEX", "TROPONINI" in the last 168 hours. BNP (last 3 results) No results for input(s): "PROBNP" in the last 8760 hours. HbA1C: No results for input(s): "HGBA1C" in the last 72 hours. CBG: Recent Labs  Lab 05/14/22 1902  GLUCAP 102*   Lipid Profile: No results for input(s): "CHOL", "HDL", "LDLCALC", "TRIG", "CHOLHDL", "LDLDIRECT" in the last 72 hours. Thyroid Function Tests: No results for input(s): "TSH", "T4TOTAL", "FREET4", "T3FREE", "THYROIDAB" in the last 72  hours. Anemia Panel: No results for input(s): "VITAMINB12", "FOLATE", "FERRITIN", "TIBC", "IRON", "RETICCTPCT" in the last 72 hours. Sepsis Labs: Recent Labs  Lab 05/14/22 1240  LATICACIDVEN 1.6    Recent Results (from the past 240 hour(s))  SARS Coronavirus 2 by RT PCR (hospital order, performed in [facility] hospital lab) *cepheid single result test* Anterior Nasal Swab     Status: None   Collection Time: 05/14/22 11:38 AM   Specimen: Anterior Nasal Swab  Result Value Ref Range Status   SARS Coronavirus 2 by RT PCR NEGATIVE NEGATIVE Final    Comment: (NOTE) SARS-CoV-2 target nucleic acids are NOT DETECTED.  The SARS-CoV-2 RNA is generally detectable in upper and lower respiratory specimens during the acute phase of infection. The lowest concentration of SARS-CoV-2 viral copies this assay can detect is 250 copies / mL. A negative result does not preclude SARS-CoV-2 infection and should not be used as the sole basis for treatment or other patient management decisions.  A negative result may occur with improper specimen collection / handling, submission of specimen other than nasopharyngeal swab, presence of viral mutation(s) within the areas targeted by this assay, and inadequate number of viral copies (<250 copies / mL). A negative result must be combined with clinical observations, patient history, and epidemiological information.  Fact Sheet for Patients:   RoadLapTop.co.za  Fact Sheet for Healthcare Providers: http://kim-miller.com/  This test is not yet approved or  cleared by the [place] FDA and has been authorized for detection and/or diagnosis of SARS-CoV-2 by FDA under an Emergency Use Authorization (EUA).  This EUA will remain in effect (meaning this test can be used) for the duration of the COVID-19 declaration under Section 564(b)(1) of the Act, 21 U.S.C. section 360bbb-3(b)(1), unless the authorization is  terminated or revoked sooner.  Performed at [facility], [address], [place], [place] [address]   Culture, blood (single)     Status: None (Preliminary result)   Collection Time: 05/14/22 11:43 AM   Specimen: BLOOD RIGHT ARM  Result Value Ref Range Status   Specimen Description   Final    BLOOD RIGHT ARM BOTTLES DRAWN AEROBIC AND ANAEROBIC   Special Requests   Final    Blood Culture results may not be optimal due to an excessive volume of blood received in culture bottles   Culture   Final    NO GROWTH < 24 HOURS Performed at [facility], [address] [place], [place] [address]    Report Status PENDING  Incomplete         Radiology Studies: CT ABDOMEN PELVIS WO CONTRAST  Result Date: 05/14/2022 CLINICAL DATA:  Abdominal pain, acute, nonlocalized EXAM: CT ABDOMEN AND PELVIS WITHOUT CONTRAST TECHNIQUE: Multidetector CT imaging of the abdomen and pelvis was performed following the standard protocol without IV contrast. RADIATION DOSE REDUCTION: This exam was performed according to the  departmental dose-optimization program which includes automated exposure control, adjustment of the mA and/or kV according to patient size and/or use of iterative reconstruction technique. COMPARISON:  December 28, 2018 FINDINGS: Evaluation is limited by lack of IV contrast and motion. Lower chest: No acute abnormality. Hepatobiliary: The gallbladder is distended. Cholelithiasis. No new extrahepatic biliary ductal dilation. No new focal hepatic lesion. There is a small amount of fluid along the inferior aspect of the gallbladder which lays immediately adjacent to the RIGHT kidney. Pancreas: No peripancreatic fat stranding. Spleen: Unremarkable. Adrenals/Urinary Tract: Adrenal glands are unremarkable. No hydronephrosis or obstructing nephrolithiasis. Bladder is unremarkable. There is new fat stranding adjacent to the RIGHT kidney with a small amount of adjacent fluid in the pericolic gutter and  retroperitoneal space. Stomach/Bowel: No evidence of bowel obstruction. Severe diverticulosis most pronounced in the sigmoid and descending colon. No evidence of acute diverticulitis. Appendix is normal. Stomach is decompressed. Vascular/Lymphatic: Atherosclerotic calcifications of the nonaneurysmal abdominal aorta. No new lymphadenopathy is identified. Reproductive: Uterus and bilateral adnexa are unremarkable. Other: Small fat containing periumbilical hernia. Musculoskeletal: Status post posterior fixation of the lower lumbar spine. IMPRESSION: 1. There is new fat stranding in the RIGHT upper quadrant. It is centered predominantly adjacent to the RIGHT kidney with trace fluid tracking in the RIGHT pericolic gutter along the gallbladder inferiorly as well as posterior to the RIGHT kidney. However, gallbladder is also distended with multiple layering cholelithiasis and lays immediately adjacent to the RIGHT kidney. Constellation of findings are favored to reflect sequela of urinary tract infection such as pyelonephritis but differential considerations include early acute cholecystitis. Recommend correlation with physical exam and urinalysis. Aortic Atherosclerosis (ICD10-I70.0). Electronically Signed   By: Meda Klinefelter M.D.   On: 05/14/2022 15:20   DG Chest Port 1 View  Result Date: 05/14/2022 CLINICAL DATA:  Generalized weakness.  Vomiting. EXAM: PORTABLE CHEST 1 VIEW COMPARISON:  11/24/2004. FINDINGS: Cardiac silhouette is normal in size. No mediastinal or hilar masses. Lungs are clear.  No pleural effusion or pneumothorax. Skeletal structures are demineralized, grossly intact. IMPRESSION: No active disease. Electronically Signed   By: Amie Portland M.D.   On: 05/14/2022 14:56        Scheduled Meds:  atropine  1 drop Left Eye BID   enoxaparin (LOVENOX) injection  30 mg Subcutaneous Q24H   latanoprost  1 drop Right Eye QHS   levothyroxine  88 mcg Oral QAC breakfast   pantoprazole  40 mg Oral  Daily   pravastatin  80 mg Oral Daily   sertraline  100 mg Oral Daily   Continuous Infusions:  0.9 % NaCl with KCl 20 mEq / L 75 mL/hr at 05/14/22 1955   cefTRIAXone (ROCEPHIN)  IV 2 g (05/15/22 0926)     LOS: 1 day    Time spent: 45 minutes spent on chart review, discussion with nursing staff, consultants, updating family and interview/physical exam; more than 50% of that time was spent in counseling and/or coordination of care.    Joseph Art, DO [facility] Available via Epic secure chat 7am-7pm After these hours, please refer to coverage provider listed on [email or web address] 05/15/2022, 10:07 AM

## 2022-05-16 DIAGNOSIS — E871 Hypo-osmolality and hyponatremia: Secondary | ICD-10-CM

## 2022-05-16 DIAGNOSIS — N179 Acute kidney failure, unspecified: Secondary | ICD-10-CM | POA: Diagnosis not present

## 2022-05-16 DIAGNOSIS — E860 Dehydration: Secondary | ICD-10-CM | POA: Diagnosis not present

## 2022-05-16 LAB — CBC
HCT: 29.2 % — ABNORMAL LOW (ref 36.0–46.0)
Hemoglobin: 9.2 g/dL — ABNORMAL LOW (ref 12.0–15.0)
MCH: 30.5 pg (ref 26.0–34.0)
MCHC: 31.5 g/dL (ref 30.0–36.0)
MCV: 96.7 fL (ref 80.0–100.0)
Platelets: 130 10*3/uL — ABNORMAL LOW (ref 150–400)
RBC: 3.02 MIL/uL — ABNORMAL LOW (ref 3.87–5.11)
RDW: 14 % (ref 11.5–15.5)
WBC: 5.5 10*3/uL (ref 4.0–10.5)
nRBC: 0 % (ref 0.0–0.2)

## 2022-05-16 LAB — BASIC METABOLIC PANEL
Anion gap: 6 (ref 5–15)
BUN: 25 mg/dL — ABNORMAL HIGH (ref 8–23)
CO2: 17 mmol/L — ABNORMAL LOW (ref 22–32)
Calcium: 8 mg/dL — ABNORMAL LOW (ref 8.9–10.3)
Chloride: 110 mmol/L (ref 98–111)
Creatinine, Ser: 1.19 mg/dL — ABNORMAL HIGH (ref 0.44–1.00)
GFR, Estimated: 45 mL/min — ABNORMAL LOW (ref >60)
Glucose, Bld: 102 mg/dL — ABNORMAL HIGH (ref 70–99)
Potassium: 4.6 mmol/L (ref 3.5–5.1)
Sodium: 133 mmol/L — ABNORMAL LOW (ref 135–145)

## 2022-05-16 LAB — URINE CULTURE: Culture: 10000 — AB

## 2022-05-16 MED ORDER — ALPRAZOLAM 0.5 MG PO TABS
0.5000 mg | ORAL_TABLET | Freq: Once | ORAL | Status: AC | PRN
  Administered 2022-05-16: 0.5 mg via ORAL

## 2022-05-16 MED ORDER — METOPROLOL SUCCINATE ER 50 MG PO TB24
50.0000 mg | ORAL_TABLET | Freq: Every day | ORAL | Status: DC
  Administered 2022-05-16 – 2022-05-18 (×3): 50 mg via ORAL
  Filled 2022-05-16 (×4): qty 1

## 2022-05-16 NOTE — Evaluation (Signed)
Physical Therapy Evaluation Patient Details Name: Aimee Ward MRN: [id number] DOB: Jul 01, 1933 Today's Date: 05/16/2022  History of Present Illness  Aimee Ward is a 89 y.o. female with medical history significant of hypertension, heart failure with preserved EF (grade 1 diastolic dysfunction), GERD, hypothyroidism, macular degeneration with significant vision loss, degenerative disc disease.  Patient was recently treated for UTI at the beginning of April.  She was treated with oral antibiotics and discharged to home from the emergency department.  She followed up with her primary care provider who encouraged her to continue her antibiotics.  She states that she never fully recovered, but began to get worse again.  She began vomiting on Wednesday and has had not been able to keep much down since that time.  Oral food and liquid exacerbate her symptoms.  She has had fevers and bodyaches.  She denies flank pain.   Clinical Impression  Patient demonstrates slightly labored movement for sitting up at bedside, frequently bumps into nearby objects during ambulation mostly due to limited eye sight and limited for ambulation mostly due to c/o fatigue.  Patient tolerated sitting up in chair with family members present after therapy.  Patient will benefit from continued skilled physical therapy in hospital and recommended venue below to increase strength, balance, endurance for safe ADLs and gait.       Recommendations for follow up therapy are one component of a multi-disciplinary discharge planning process, led by the attending physician.  Recommendations may be updated based on patient status, additional functional criteria and insurance authorization.  Follow Up Recommendations       Assistance Recommended at Discharge Set up Supervision/Assistance  Patient can return home with the following  A little help with walking and/or transfers;A little help with bathing/dressing/bathroom;Help with  stairs or ramp for entrance;Assistance with cooking/housework    Equipment Recommendations None recommended by PT  Recommendations for Other Services       Functional Status Assessment Patient has had a recent decline in their functional status and demonstrates the ability to make significant improvements in function in a reasonable and predictable amount of time.     Precautions / Restrictions Precautions Precautions: Fall Restrictions Weight Bearing Restrictions: No      Mobility  Bed Mobility Overal bed mobility: Needs Assistance Bed Mobility: Supine to Sit     Supine to sit: Supervision, Modified independent (Device/Increase time)     General bed mobility comments: labored movement, increased time    Transfers Overall transfer level: Needs assistance Equipment used: Rolling walker (2 wheels) Transfers: Sit to/from Stand, Bed to chair/wheelchair/BSC Sit to Stand: Min guard   Step pivot transfers: Min guard, Min assist       General transfer comment: required assistance mostly due to limited eye sight    Ambulation/Gait Ambulation/Gait assistance: Min assist Gait Distance (Feet): 65 Feet Assistive device: Rolling walker (2 wheels) Gait Pattern/deviations: Decreased step length - left, Decreased stance time - right, Decreased stride length, Drifts right/left Gait velocity: decreased     General Gait Details: slow labored cadence with frequent bumping into nearby objects mostly due to limited eye sight, limited for ambulation mostly due to c/o fatigue  Stairs            Wheelchair Mobility    Modified Rankin (Stroke Patients Only)       Balance Overall balance assessment: Needs assistance Sitting-balance support: Feet supported, No upper extremity supported Sitting balance-Leahy Scale: Good Sitting balance - Comments: seated at EOB  Standing balance support: During functional activity, Bilateral upper extremity supported, Reliant on assistive  device for balance Standing balance-Leahy Scale: Fair Standing balance comment: fair/good using RW                             Pertinent Vitals/Pain Pain Assessment Pain Assessment: Faces Faces Pain Scale: Hurts a little bit Pain Location: headache back of head Pain Descriptors / Indicators: Headache Pain Intervention(s): Limited activity within patient's tolerance, Monitored during session    Home Living Family/patient expects to be discharged to:: Private residence Living Arrangements: Children Available Help at Discharge: Family;Available 24 hours/day Type of Home: House Home Access: Stairs to enter Entrance Stairs-Rails: Right Entrance Stairs-Number of Steps: 6   Home Layout: One level Home Equipment: Agricultural consultant (2 wheels);Wheelchair - manual;Cane - quad;Shower seat      Prior Function Prior Level of Function : Needs assist       Physical Assist : Mobility (physical);ADLs (physical) Mobility (physical): Bed mobility;Transfers;Gait;Stairs   Mobility Comments: household ambulator using RW, 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 ADLs Comments: assisted by family     Hand Dominance   Dominant Hand: Right    Extremity/Trunk Assessment   Upper Extremity Assessment Upper Extremity Assessment: Overall WFL for tasks assessed    Lower Extremity Assessment Lower Extremity Assessment: Generalized weakness    Cervical / Trunk Assessment Cervical / Trunk Assessment: Normal  Communication   Communication: No difficulties  Cognition Arousal/Alertness: Awake/alert Behavior During Therapy: WFL for tasks assessed/performed Overall Cognitive Status: Within Functional Limits for tasks assessed                                          General Comments      Exercises     Assessment/Plan    PT Assessment Patient needs continued PT services  PT Problem List Decreased strength;Decreased activity tolerance;Decreased balance;Decreased mobility        PT Treatment Interventions DME instruction;Gait training;Stair training;Functional mobility training;Therapeutic activities;Therapeutic exercise;Patient/family education;Balance training    PT Goals (Current goals can be found in the Care Plan section)  Acute Rehab PT Goals Patient Stated Goal: return  home with family to assist PT Goal Formulation: With patient/family Time For Goal Achievement: 05/20/22 Potential to Achieve Goals: Good    Frequency Min 3X/week     Co-evaluation               AM-PAC PT "6 Clicks" Mobility  Outcome Measure Help needed turning from your back to your side while in a flat bed without using bedrails?: None Help needed moving from lying on your back to sitting on the side of a flat bed without using bedrails?: None Help needed moving to and from a bed to a chair (including a wheelchair)?: A Little Help needed standing up from a chair using your arms (e.g., wheelchair or bedside chair)?: A Little Help needed to walk in hospital room?: A Lot Help needed climbing 3-5 steps with a railing? : A Lot 6 Click Score: 18    End of Session   Activity Tolerance: Patient tolerated treatment well;Patient limited by fatigue Patient left: in chair;with call bell/phone within reach Nurse Communication: Mobility status PT Visit Diagnosis: Unsteadiness on feet (R26.81);Other abnormalities of gait and mobility (R26.89);Muscle weakness (generalized) (M62.81)    Time: 1610-9604 PT Time Calculation (min) (ACUTE ONLY): 16 min  Charges:   PT Evaluation $PT Eval Low Complexity: 1 Low PT Treatments $Therapeutic Activity: 8-22 mins        3:44 PM, 05/16/22 Ocie Bob, MPT Physical Therapist with [facility] [phone] office [phone] mobile phone

## 2022-05-16 NOTE — Progress Notes (Signed)
PROGRESS NOTE    Aimee Ward  ZOX:[id number] DOB: 06-Oct-1933 DOA: 05/14/2022 PCP: April Manson, NP    Brief Narrative:  Aimee Ward is a 89 y.o. female with medical history significant of hypertension, heart failure with preserved EF (grade 1 diastolic dysfunction), GERD, hypothyroidism, macular degeneration with significant vision loss, degenerative disc disease.  Patient was recently treated for UTI at the beginning of April.  She was treated with oral antibiotics and discharged to home from the emergency department.  She followed up with her primary care provider who encouraged her to continue her antibiotics.  She states that she never fully recovered, but began to get worse again.  She began vomiting on Wednesday and has had not been able to keep much down since that time.  Oral food and liquid exacerbate her symptoms.  She has had fevers and bodyaches.     Assessment and Plan: Sepsis due to bacteremia/UTI/pyelonephritis -Broad-spectrum antibiotics were initiated by the EDP along with blood cultures and urine cultures -narrow the antibiotics  -recently seen in ER and treated for presumed UTI with keflex - last urine culture (2020),  showed E. coli sensitive to cephalosporins--- narrow antibiotics to just Rocephin -check PVR  Hyponatremia secondary to dehydration, metabolic acidosis secondary to infection -improving  AKI  -trending down  hypertension, HFrEF - resume BB  Hypothyroidism -Continue levothyroxine  PT eval-- lives at home with daughter   DVT prophylaxis: enoxaparin (LOVENOX) injection 30 mg Start: 05/14/22 1930    Code Status: DNR Family Communication: at bedside  Disposition Plan:  Level of care: Med-Surg Status is: Inpatient Remains inpatient appropriate because: needs IV abx    Consultants:  none    Subjective: Still with limited appetite  Objective: Vitals:   05/15/22 2049 05/16/22 0041 05/16/22 0316 05/16/22 0902  BP: (!) 143/77  (!) 129/109 (!) 158/77 (!) 166/92  Pulse: 96 91 87 95  Resp: 20  20 18   Temp: (!) 102 F (38.9 C) 98.6 F (37 C) 97.8 F (36.6 C) 99.7 F (37.6 C)  TempSrc: Oral  Oral Oral  SpO2: 95% 97% 94% 95%    Intake/Output Summary (Last 24 hours) at 05/16/2022 0958 Last data filed at 05/15/2022 1800 Gross per 24 hour  Intake 1725 ml  Output 1000 ml  Net 725 ml   There were no vitals filed for this visit.  Examination:    General: Appearance:     Overweight female in no acute distress     Lungs:     respirations unlabored  Heart:    Normal heart rate. Normal rhythm.  +murmur  MS:   All extremities are intact.   Neurologic:   Awake, alert         Data Reviewed: I have personally reviewed following labs and imaging studies  CBC: Recent Labs  Lab 05/14/22 1240 05/15/22 0445 05/16/22 0425  WBC 8.4 7.1 5.5  HGB 11.4* 9.4* 9.2*  HCT 35.3* 29.7* 29.2*  MCV 94.9 97.1 96.7  PLT 119* 108* 130*   Basic Metabolic Panel: Recent Labs  Lab 05/14/22 1224 05/14/22 1240 05/15/22 0445 05/16/22 0425  NA  --  128* 131* 133*  K  --  4.3 4.5 4.6  CL  --  100 107 110  CO2  --  18* 16* 17*  GLUCOSE  --  113* 95 102*  BUN  --  41* 34* 25*  CREATININE 1.89* 1.92* 1.47* 1.19*  CALCIUM  --  8.1* 7.7* 8.0*   GFR:  CrCl cannot be calculated (Unknown ideal weight.). Liver Function Tests: Recent Labs  Lab 05/14/22 1240  AST 18  ALT 11  ALKPHOS 67  BILITOT 1.0  PROT 7.5  ALBUMIN 3.1*   Recent Labs  Lab 05/14/22 1240  LIPASE 30   No results for input(s): "AMMONIA" in the last 168 hours. Coagulation Profile: No results for input(s): "INR", "PROTIME" in the last 168 hours. Cardiac Enzymes: No results for input(s): "CKTOTAL", "CKMB", "CKMBINDEX", "TROPONINI" in the last 168 hours. BNP (last 3 results) No results for input(s): "PROBNP" in the last 8760 hours. HbA1C: No results for input(s): "HGBA1C" in the last 72 hours. CBG: Recent Labs  Lab 05/14/22 1902  GLUCAP 102*    Lipid Profile: No results for input(s): "CHOL", "HDL", "LDLCALC", "TRIG", "CHOLHDL", "LDLDIRECT" in the last 72 hours. Thyroid Function Tests: No results for input(s): "TSH", "T4TOTAL", "FREET4", "T3FREE", "THYROIDAB" in the last 72 hours. Anemia Panel: No results for input(s): "VITAMINB12", "FOLATE", "FERRITIN", "TIBC", "IRON", "RETICCTPCT" in the last 72 hours. Sepsis Labs: Recent Labs  Lab 05/14/22 1240  LATICACIDVEN 1.6    Recent Results (from the past 240 hour(s))  SARS Coronavirus 2 by RT PCR (hospital order, performed in [facility] hospital lab) *cepheid single result test* Anterior Nasal Swab     Status: None   Collection Time: 05/14/22 11:38 AM   Specimen: Anterior Nasal Swab  Result Value Ref Range Status   SARS Coronavirus 2 by RT PCR NEGATIVE NEGATIVE Final    Comment: (NOTE) SARS-CoV-2 target nucleic acids are NOT DETECTED.  The SARS-CoV-2 RNA is generally detectable in upper and lower respiratory specimens during the acute phase of infection. The lowest concentration of SARS-CoV-2 viral copies this assay can detect is 250 copies / mL. A negative result does not preclude SARS-CoV-2 infection and should not be used as the sole basis for treatment or other patient management decisions.  A negative result may occur with improper specimen collection / handling, submission of specimen other than nasopharyngeal swab, presence of viral mutation(s) within the areas targeted by this assay, and inadequate number of viral copies (<250 copies / mL). A negative result must be combined with clinical observations, patient history, and epidemiological information.  Fact Sheet for Patients:   RoadLapTop.co.za  Fact Sheet for Healthcare Providers: http://kim-miller.com/  This test is not yet approved or  cleared by the [place] FDA and has been authorized for detection and/or diagnosis of SARS-CoV-2 by FDA under an Emergency  Use Authorization (EUA).  This EUA will remain in effect (meaning this test can be used) for the duration of the COVID-19 declaration under Section 564(b)(1) of the Act, 21 U.S.C. section 360bbb-3(b)(1), unless the authorization is terminated or revoked sooner.  Performed at [facility], [address] [place], [place] [address]   Culture, blood (single)     Status: Abnormal (Preliminary result)   Collection Time: 05/14/22 11:43 AM   Specimen: BLOOD RIGHT ARM  Result Value Ref Range Status   Specimen Description   Final    BLOOD RIGHT ARM BOTTLES DRAWN AEROBIC AND ANAEROBIC Performed at [facility], [address] [place], [place] [address]    Special Requests   Final    Blood Culture results may not be optimal due to an excessive volume of blood received in culture bottles Performed at [facility], [address] [place], [place] [address]    Culture  Setup Time   Final    GRAM NEGATIVE RODS Gram Stain Report Called to,Read Back  By and Verified With:  V. DILDY @ 1709 BY STEPHTR 05/15/22 Organism ID to follow CRITICAL RESULT CALLED TO, READ BACK BY AND VERIFIED WITH: RN Nyra Capes [id number] AT 2120 BY EC    Culture (A)  Final    ESCHERICHIA COLI SUSCEPTIBILITIES TO FOLLOW Performed at [facility], [address], [place], [place] [address]    Report Status PENDING  Incomplete  Blood Culture ID Panel (Reflexed)     Status: Abnormal   Collection Time: 05/14/22 11:43 AM  Result Value Ref Range Status   Enterococcus faecalis NOT DETECTED NOT DETECTED Final   Enterococcus Faecium NOT DETECTED NOT DETECTED Final   Listeria monocytogenes NOT DETECTED NOT DETECTED Final   Staphylococcus species NOT DETECTED NOT DETECTED Final   Staphylococcus aureus (BCID) NOT DETECTED NOT DETECTED Final   Staphylococcus epidermidis NOT DETECTED NOT DETECTED Final   Staphylococcus lugdunensis NOT DETECTED NOT DETECTED Final   Streptococcus species NOT DETECTED NOT DETECTED Final    Streptococcus agalactiae NOT DETECTED NOT DETECTED Final   Streptococcus pneumoniae NOT DETECTED NOT DETECTED Final   Streptococcus pyogenes NOT DETECTED NOT DETECTED Final   A.calcoaceticus-baumannii NOT DETECTED NOT DETECTED Final   Bacteroides fragilis NOT DETECTED NOT DETECTED Final   Enterobacterales DETECTED (A) NOT DETECTED Final    Comment: Enterobacterales represent a large order of gram negative bacteria, not a single organism.   Enterobacter cloacae complex NOT DETECTED NOT DETECTED Final   Escherichia coli DETECTED (A) NOT DETECTED Final    Comment: CRITICAL RESULT CALLED TO, READ BACK BY AND VERIFIED WITH: RN Autumn Patty [phone] AT 2120 BY EC    Klebsiella aerogenes NOT DETECTED NOT DETECTED Final   Klebsiella oxytoca NOT DETECTED NOT DETECTED Final   Klebsiella pneumoniae NOT DETECTED NOT DETECTED Final   Proteus species NOT DETECTED NOT DETECTED Final   Salmonella species NOT DETECTED NOT DETECTED Final   Serratia marcescens NOT DETECTED NOT DETECTED Final   Haemophilus influenzae NOT DETECTED NOT DETECTED Final   Neisseria meningitidis NOT DETECTED NOT DETECTED Final   Pseudomonas aeruginosa NOT DETECTED NOT DETECTED Final   Stenotrophomonas maltophilia NOT DETECTED NOT DETECTED Final   Candida albicans NOT DETECTED NOT DETECTED Final   Candida auris NOT DETECTED NOT DETECTED Final   Candida glabrata NOT DETECTED NOT DETECTED Final   Candida krusei NOT DETECTED NOT DETECTED Final   Candida parapsilosis NOT DETECTED NOT DETECTED Final   Candida tropicalis NOT DETECTED NOT DETECTED Final   Cryptococcus neoformans/gattii NOT DETECTED NOT DETECTED Final   CTX-M ESBL NOT DETECTED NOT DETECTED Final   Carbapenem resistance IMP NOT DETECTED NOT DETECTED Final   Carbapenem resistance KPC NOT DETECTED NOT DETECTED Final   Carbapenem resistance NDM NOT DETECTED NOT DETECTED Final   Carbapenem resist OXA 48 LIKE NOT DETECTED NOT DETECTED Final   Carbapenem resistance VIM  NOT DETECTED NOT DETECTED Final    Comment: Performed at [facility], [address], [place], [place] [address]  Urine Culture     Status: Abnormal (Preliminary result)   Collection Time: 05/14/22  2:51 PM   Specimen: Urine, Clean Catch  Result Value Ref Range Status   Specimen Description   Final    URINE, CLEAN CATCH Performed at [facility], [address] [place], [place] [address]    Special Requests   Final    NONE Performed at [facility], [address] [place], [place] [address]    Culture (A)  Final  10,000 COLONIES/mL GRAM NEGATIVE RODS SUSCEPTIBILITIES TO FOLLOW Performed at [facility], [address] [place], [place] [address]    Report Status PENDING  Incomplete         Radiology Studies: CT ABDOMEN PELVIS WO CONTRAST  Result Date: 05/14/2022 CLINICAL DATA:  Abdominal pain, acute, nonlocalized EXAM: CT ABDOMEN AND PELVIS WITHOUT CONTRAST TECHNIQUE: Multidetector CT imaging of the abdomen and pelvis was performed following the standard protocol without IV contrast. RADIATION DOSE REDUCTION: This exam was performed according to the departmental dose-optimization program which includes automated exposure control, adjustment of the mA and/or kV according to patient size and/or use of iterative reconstruction technique. COMPARISON:  December 28, 2018 FINDINGS: Evaluation is limited by lack of IV contrast and motion. Lower chest: No acute abnormality. Hepatobiliary: The gallbladder is distended. Cholelithiasis. No new extrahepatic biliary ductal dilation. No new focal hepatic lesion. There is a small amount of fluid along the inferior aspect of the gallbladder which lays immediately adjacent to the RIGHT kidney. Pancreas: No peripancreatic fat stranding. Spleen: Unremarkable. Adrenals/Urinary Tract: Adrenal glands are unremarkable. No hydronephrosis or obstructing nephrolithiasis. Bladder is unremarkable. There is new fat stranding adjacent to the RIGHT kidney  with a small amount of adjacent fluid in the pericolic gutter and retroperitoneal space. Stomach/Bowel: No evidence of bowel obstruction. Severe diverticulosis most pronounced in the sigmoid and descending colon. No evidence of acute diverticulitis. Appendix is normal. Stomach is decompressed. Vascular/Lymphatic: Atherosclerotic calcifications of the nonaneurysmal abdominal aorta. No new lymphadenopathy is identified. Reproductive: Uterus and bilateral adnexa are unremarkable. Other: Small fat containing periumbilical hernia. Musculoskeletal: Status post posterior fixation of the lower lumbar spine. IMPRESSION: 1. There is new fat stranding in the RIGHT upper quadrant. It is centered predominantly adjacent to the RIGHT kidney with trace fluid tracking in the RIGHT pericolic gutter along the gallbladder inferiorly as well as posterior to the RIGHT kidney. However, gallbladder is also distended with multiple layering cholelithiasis and lays immediately adjacent to the RIGHT kidney. Constellation of findings are favored to reflect sequela of urinary tract infection such as pyelonephritis but differential considerations include early acute cholecystitis. Recommend correlation with physical exam and urinalysis. Aortic Atherosclerosis (ICD10-I70.0). Electronically Signed   By: Meda Klinefelter M.D.   On: 05/14/2022 15:20   DG Chest Port 1 View  Result Date: 05/14/2022 CLINICAL DATA:  Generalized weakness.  Vomiting. EXAM: PORTABLE CHEST 1 VIEW COMPARISON:  11/24/2004. FINDINGS: Cardiac silhouette is normal in size. No mediastinal or hilar masses. Lungs are clear.  No pleural effusion or pneumothorax. Skeletal structures are demineralized, grossly intact. IMPRESSION: No active disease. Electronically Signed   By: Amie Portland M.D.   On: 05/14/2022 14:56        Scheduled Meds:  atropine  1 drop Left Eye BID   enoxaparin (LOVENOX) injection  30 mg Subcutaneous Q24H   latanoprost  1 drop Right Eye QHS    levothyroxine  88 mcg Oral QAC breakfast   metoprolol succinate  50 mg Oral Daily   pantoprazole  40 mg Oral Daily   pravastatin  80 mg Oral Daily   sertraline  100 mg Oral Daily   Continuous Infusions:  cefTRIAXone (ROCEPHIN)  IV 2 g (05/15/22 0926)     LOS: 2 days    Time spent: 45 minutes spent on chart review, discussion with nursing staff, consultants, updating family and interview/physical exam; more than 50% of that time was spent in counseling and/or coordination of care.    Joseph Art, DO  [facility] Available via Epic secure chat 7am-7pm After these hours, please refer to coverage provider listed on [email or web address] 05/16/2022, 9:58 AM

## 2022-05-16 NOTE — Progress Notes (Signed)
Asked patient to sit in chair. She said not at this time but maybe later.

## 2022-05-16 NOTE — Progress Notes (Signed)
Patient ambulated in hallway times one assist using front wheel walker without difficulty.Plan of care ongoing.

## 2022-05-16 NOTE — Plan of Care (Signed)
  Problem: Acute Rehab PT Goals(only PT should resolve) Goal: Pt Will Go Supine/Side To Sit Outcome: Progressing Flowsheets (Taken 05/16/2022 1546) Pt will go Supine/Side to Sit:  with modified independence  Independently Goal: Patient Will Transfer Sit To/From Stand Outcome: Progressing Flowsheets (Taken 05/16/2022 1546) Patient will transfer sit to/from stand:  with modified independence  with supervision Goal: Pt Will Transfer Bed To Chair/Chair To Bed Outcome: Progressing Flowsheets (Taken 05/16/2022 1546) Pt will Transfer Bed to Chair/Chair to Bed:  with modified independence  with supervision Goal: Pt Will Ambulate Outcome: Progressing Flowsheets (Taken 05/16/2022 1546) Pt will Ambulate:  75 feet  with min guard assist  with rolling walker   3:46 PM, 05/16/22 Ocie Bob, MPT Physical Therapist with [facility] [address] [address] [phone] office [phone] mobile phone

## 2022-05-17 DIAGNOSIS — N390 Urinary tract infection, site not specified: Secondary | ICD-10-CM | POA: Diagnosis not present

## 2022-05-17 DIAGNOSIS — E860 Dehydration: Secondary | ICD-10-CM | POA: Diagnosis not present

## 2022-05-17 DIAGNOSIS — N179 Acute kidney failure, unspecified: Secondary | ICD-10-CM | POA: Diagnosis not present

## 2022-05-17 LAB — BASIC METABOLIC PANEL
Anion gap: 6 (ref 5–15)
BUN: 18 mg/dL (ref 8–23)
CO2: 20 mmol/L — ABNORMAL LOW (ref 22–32)
Calcium: 8.4 mg/dL — ABNORMAL LOW (ref 8.9–10.3)
Chloride: 107 mmol/L (ref 98–111)
Creatinine, Ser: 1.04 mg/dL — ABNORMAL HIGH (ref 0.44–1.00)
GFR, Estimated: 52 mL/min — ABNORMAL LOW (ref >60)
Glucose, Bld: 108 mg/dL — ABNORMAL HIGH (ref 70–99)
Potassium: 4.5 mmol/L (ref 3.5–5.1)
Sodium: 133 mmol/L — ABNORMAL LOW (ref 135–145)

## 2022-05-17 LAB — CBC
HCT: 30.4 % — ABNORMAL LOW (ref 36.0–46.0)
Hemoglobin: 9.6 g/dL — ABNORMAL LOW (ref 12.0–15.0)
MCH: 30.6 pg (ref 26.0–34.0)
MCHC: 31.6 g/dL (ref 30.0–36.0)
MCV: 96.8 fL (ref 80.0–100.0)
Platelets: 128 10*3/uL — ABNORMAL LOW (ref 150–400)
RBC: 3.14 MIL/uL — ABNORMAL LOW (ref 3.87–5.11)
RDW: 13.8 % (ref 11.5–15.5)
WBC: 4.4 10*3/uL (ref 4.0–10.5)
nRBC: 0 % (ref 0.0–0.2)

## 2022-05-17 LAB — CULTURE, BLOOD (SINGLE)

## 2022-05-17 LAB — URINE CULTURE

## 2022-05-17 MED ORDER — HYDRALAZINE HCL 20 MG/ML IJ SOLN
5.0000 mg | INTRAMUSCULAR | Status: DC | PRN
  Administered 2022-05-17: 5 mg via INTRAVENOUS
  Filled 2022-05-17: qty 1

## 2022-05-17 NOTE — Progress Notes (Signed)
Physical Therapy Treatment Patient Details Name: Aimee Ward MRN: [id number] DOB: January 15, 1933 Today's Date: 05/17/2022   History of Present Illness Aimee Ward is a 89 y.o. female with medical history significant of hypertension, heart failure with preserved EF (grade 1 diastolic dysfunction), GERD, hypothyroidism, macular degeneration with significant vision loss, degenerative disc disease.  Patient was recently treated for UTI at the beginning of April.  She was treated with oral antibiotics and discharged to home from the emergency department.  She followed up with her primary care provider who encouraged her to continue her antibiotics.  She states that she never fully recovered, but began to get worse again.  She began vomiting on Wednesday and has had not been able to keep much down since that time.  Oral food and liquid exacerbate her symptoms.  She has had fevers and bodyaches.  She denies flank pain.    PT Comments    Pt friendly and willing to participate with therapy today.  Pt presents with improved speed with bed mobility with mod I, use of handrail to assist to sitting.  Min guard for safety with transfer to standing, used RW for stability upon standing.  Pt limited by fatigue, decreased vision and hearing needing therapist for safety during gait to avoid obstacles.  Ambulates with slow labored cadence with no LOB episodes.  No reports of pain through session, was limited by fatigue.  EOS pt left with call bell within reach and RN aware of status.    Recommendations for follow up therapy are one component of a multi-disciplinary discharge planning process, led by the attending physician.  Recommendations may be updated based on patient status, additional functional criteria and insurance authorization.  Follow Up Recommendations       Assistance Recommended at Discharge    Patient can return home with the following     Equipment Recommendations       Recommendations  for Other Services       Precautions / Restrictions Precautions Precautions: Fall Restrictions Weight Bearing Restrictions: No     Mobility  Bed Mobility Overal bed mobility: Modified Independent       Supine to sit: Supervision     General bed mobility comments: labored movement, increased time    Transfers Overall transfer level: Needs assistance Equipment used: Rolling walker (2 wheels) Transfers: Sit to/from Stand Sit to Stand: Min guard           General transfer comment: Cueing for hand placement to stand safetly to RW for stabilty upon standing    Ambulation/Gait Ambulation/Gait assistance: Min assist Gait Distance (Feet): 80 Feet Assistive device: Rolling walker (2 wheels) Gait Pattern/deviations: Decreased step length - left, Decreased stance time - right, Decreased stride length, Drifts right/left Gait velocity: decreased     General Gait Details: slow labored cadence with frequent bumping into nearby objects mostly due to limited eye sight, limited for ambulation mostly due to c/o fatigue   Stairs             Wheelchair Mobility    Modified Rankin (Stroke Patients Only)       Balance                                            Cognition Arousal/Alertness: Awake/alert Behavior During Therapy: WFL for tasks assessed/performed Overall Cognitive Status: Within Functional Limits for tasks assessed  Exercises      General Comments        Pertinent Vitals/Pain Pain Assessment Pain Assessment: 0-10    Home Living                          Prior Function            PT Goals (current goals can now be found in the care plan section)      Frequency           PT Plan      Co-evaluation              AM-PAC PT "6 Clicks" Mobility   Outcome Measure  Help needed turning from your back to your side while in a flat bed without  using bedrails?: None Help needed moving from lying on your back to sitting on the side of a flat bed without using bedrails?: None Help needed moving to and from a bed to a chair (including a wheelchair)?: A Little Help needed standing up from a chair using your arms (e.g., wheelchair or bedside chair)?: A Little Help needed to walk in hospital room?: A Little Help needed climbing 3-5 steps with a railing? : A Lot 6 Click Score: 19    End of Session Equipment Utilized During Treatment: Gait belt Activity Tolerance: Patient tolerated treatment well;Patient limited by fatigue Patient left: in chair;with call bell/phone within reach Nurse Communication: Mobility status PT Visit Diagnosis: Unsteadiness on feet (R26.81);Other abnormalities of gait and mobility (R26.89);Muscle weakness (generalized) (M62.81)     Time: 1610-9604 PT Time Calculation (min) (ACUTE ONLY): 24 min  Charges:  $Therapeutic Activity: 23-37 mins                     Becky Sax, LPTA/CLT; CBIS [phone]  Juel Burrow 05/17/2022, 4:35 PM

## 2022-05-17 NOTE — Progress Notes (Signed)
Patient lying in bed with eyes closed resting. Pt. Complained of headache pain several times throughout the night/shift. PRN pain medications given. Pt. States that she normally has headaches at home a lot also. MD notified and will report off to oncoming shift. Pt. Walked several times throughout the night/shift to the bathroom, with a stable gait. Will continue to monitor and report off to oncoming shift.   At 5:11am: pt. States that her head feels better now.

## 2022-05-17 NOTE — TOC Progression Note (Signed)
Transition of Care [facility]) - Progression Note    Patient Details  Name: Aimee Ward MRN: [id number] Date of Birth: 08-08-1933  Transition of Care [facility]) CM/SW Contact  Elliot Gault, LCSW Phone Number: 05/17/2022, 11:11 AM  Clinical Narrative:     TOC following. PT recommending HH at dc. MD anticipating dc tomorrow. Spoke with pt/dtr today to review dc planning.  Pt and dtr agreeable to [facility]. CMS provider options reviewed. Referred to [facility] and pt accepted. Dtr is not anticipating any other TOC needs for dc.  Will follow up in AM.  Expected Discharge Plan: Home w Home Health Services Barriers to Discharge: Continued Medical Work up   Patient Goals and CMS Choice Patient states their goals for this hospitalization and ongoing recovery are:: return home with dtr CMS Medicare.gov Compare Post Acute Care list provided to:: Patient Represenative (must comment) Choice offered to / list presented to : Adult Children  Expected Discharge Plan and Services Expected Discharge Plan: Home w Home Health Services In-house Referral: Clinical Social Work   Post Acute Care Choice: Home Health Living arrangements for the past 2 months: Single Family Home                           HH Arranged: PT, OT HH Agency: [facility] Date HH Agency Contacted: 05/17/22   Representative spoke with at [facility] Agency: Morrie Sheldon  Prior Living Arrangements/Services Living arrangements for the past 2 months: Single Family Home Lives with:: Adult Children Patient language and need for interpreter reviewed:: Yes Do you feel safe going back to the place where you live?: Yes      Need for Family Participation in Patient Care: Yes (Comment) Care giver support system in place?: Yes (comment) Current home services: DME Criminal Activity/Legal Involvement Pertinent to Current Situation/Hospitalization: No - Comment as needed  Activities of Daily Living   ADL Screening (condition at time  of admission) Is the patient deaf or have difficulty hearing?: Yes Does the patient have difficulty seeing, even when wearing glasses/contacts?: Yes Does the patient have difficulty concentrating, remembering, or making decisions?: No Does the patient have difficulty dressing or bathing?: Yes Does the patient have difficulty walking or climbing stairs?: Yes  Permission Sought/Granted Permission sought to share information with : Facility Industrial/product designer granted to share information with : Yes, Verbal Permission Granted     Permission granted to share info w AGENCY: HH        Emotional Assessment       Orientation: : Oriented to Self, Oriented to Place, Oriented to  Time, Oriented to Situation Alcohol / Substance Use: Not Applicable Psych Involvement: No (comment)  Admission diagnosis:  Dehydration [E86.0] Hyponatremia [E87.1] Thrombocytopenia (HCC) [D69.6] Acute UTI [N39.0] AKI (acute kidney injury) (HCC) [N17.9] Sepsis (HCC) [A41.9] Patient Active Problem List   Diagnosis Date Noted   Sepsis (HCC) 05/14/2022   Acute pyelonephritis 05/14/2022   AKI (acute kidney injury) (HCC) 05/14/2022   Hypothyroidism 05/14/2022   Macular degeneration, bilateral 05/14/2022   Hyponatremia 05/14/2022   Dehydration 05/14/2022   Displacement of intraocular lens,, right 06/15/2021   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 involvement 03/03/2020   Exudative age-related macular degeneration of left eye with inactive choroidal neovascularization (HCC) 03/03/2020   Narrow angle glaucoma of left eye 03/02/2020   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 [place] [address] Phone: [phone] Fax: [phone]     Social Determinants of Health (SDOH) Interventions    Readmission Risk Interventions     No data to display           Expected Discharge Plan: Home w Home Health Services Barriers to Discharge: Continued Medical Work up  Expected Discharge Plan and Services In-house Referral: Clinical Social Work   Post Acute Care Choice: Home Health Living arrangements for the past 2 months: Single Family Home                           HH Arranged: PT, OT HH [facility]: [facility] Date HH Agency Contacted: 05/17/22   Representative spoke with at [facility] Agency: Morrie Sheldon   Social Determinants of Health (SDOH) Interventions SDOH Screenings   Tobacco Use: Low Risk  (05/14/2022)    Readmission Risk Interventions     No data to display

## 2022-05-17 NOTE — Progress Notes (Signed)
PROGRESS NOTE    Aimee Ward  ZOX:[id number] DOB: [birth year] DOA: 05/14/2022 PCP: April Manson, NP    Brief Narrative:  Aimee Ward is a 89 y.o. female with medical history significant of hypertension, heart failure with preserved EF (grade 1 diastolic dysfunction), GERD, hypothyroidism, macular degeneration with significant vision loss, degenerative disc disease.  Patient was recently treated for UTI at the beginning of April.  She was treated with oral antibiotics and discharged to home from the emergency department with no culture done.  She states that she never fully recovered, but began to get worse again.  Found to have ecoli bacteremia.   Assessment and Plan: Sepsis due to bacteremia/UTI/pyelonephritis -rocephin for now - last urine culture (2020),  showed E. coli sensitive to cephalosporins--- narrow antibiotics to just Rocephin -check PVR  Hyponatremia secondary to dehydration, metabolic acidosis secondary to infection -improving  AKI  --normalizing  hypertension, HFrEF - resume BB -add PRN  Hypothyroidism -Continue levothyroxine  PT eval-- lives at home with daughter-- recommend home health   DVT prophylaxis: enoxaparin (LOVENOX) injection 30 mg Start: 05/14/22 1930    Code Status: DNR Family Communication: at bedside  Disposition Plan:  Level of care: Med-Surg Status is: Inpatient Remains inpatient appropriate because: home in AM    Consultants:  none    Subjective: C/o headache  Objective: Vitals:   05/16/22 0902 05/16/22 1551 05/16/22 2027 05/17/22 0345  BP: (!) 166/92 (!) 154/80 (!) 152/79 (!) 155/74  Pulse: 95 90 97 78  Resp: 18 20 18 18   Temp: 99.7 F (37.6 C) 99.8 F (37.7 C) 99.8 F (37.7 C) 98.4 F (36.9 C)  TempSrc: Oral Oral Oral Oral  SpO2: 95% 96% 97% 98%    Intake/Output Summary (Last 24 hours) at 05/17/2022 1037 Last data filed at 05/16/2022 1716 Gross per 24 hour  Intake 340 ml  Output 900 ml  Net -560 ml    There were no vitals filed for this visit.  Examination:    General: Appearance:     Overweight female in no acute distress     Lungs:     respirations unlabored  Heart:    Normal heart rate.  MS:   All extremities are intact.   Neurologic:   Awake, alert         Data Reviewed: I have personally reviewed following labs and imaging studies  CBC: Recent Labs  Lab 05/14/22 1240 05/15/22 0445 05/16/22 0425 05/17/22 0410  WBC 8.4 7.1 5.5 4.4  HGB 11.4* 9.4* 9.2* 9.6*  HCT 35.3* 29.7* 29.2* 30.4*  MCV 94.9 97.1 96.7 96.8  PLT 119* 108* 130* 128*   Basic Metabolic Panel: Recent Labs  Lab 05/14/22 1224 05/14/22 1240 05/15/22 0445 05/16/22 0425 05/17/22 0410  NA  --  128* 131* 133* 133*  K  --  4.3 4.5 4.6 4.5  CL  --  100 107 110 107  CO2  --  18* 16* 17* 20*  GLUCOSE  --  113* 95 102* 108*  BUN  --  41* 34* 25* 18  CREATININE 1.89* 1.92* 1.47* 1.19* 1.04*  CALCIUM  --  8.1* 7.7* 8.0* 8.4*   GFR: CrCl cannot be calculated (Unknown ideal weight.). Liver Function Tests: Recent Labs  Lab 05/14/22 1240  AST 18  ALT 11  ALKPHOS 67  BILITOT 1.0  PROT 7.5  ALBUMIN 3.1*   Recent Labs  Lab 05/14/22 1240  LIPASE 30   No results for input(s): "AMMONIA" in the  last 168 hours. Coagulation Profile: No results for input(s): "INR", "PROTIME" in the last 168 hours. Cardiac Enzymes: No results for input(s): "CKTOTAL", "CKMB", "CKMBINDEX", "TROPONINI" in the last 168 hours. BNP (last 3 results) No results for input(s): "PROBNP" in the last 8760 hours. HbA1C: No results for input(s): "HGBA1C" in the last 72 hours. CBG: Recent Labs  Lab 05/14/22 1902  GLUCAP 102*   Lipid Profile: No results for input(s): "CHOL", "HDL", "LDLCALC", "TRIG", "CHOLHDL", "LDLDIRECT" in the last 72 hours. Thyroid Function Tests: No results for input(s): "TSH", "T4TOTAL", "FREET4", "T3FREE", "THYROIDAB" in the last 72 hours. Anemia Panel: No results for input(s): "VITAMINB12",  "FOLATE", "FERRITIN", "TIBC", "IRON", "RETICCTPCT" in the last 72 hours. Sepsis Labs: Recent Labs  Lab 05/14/22 1240  LATICACIDVEN 1.6    Recent Results (from the past 240 hour(s))  SARS Coronavirus 2 by RT PCR (hospital order, performed in [facility] hospital lab) *cepheid single result test* Anterior Nasal Swab     Status: None   Collection Time: 05/14/22 11:38 AM   Specimen: Anterior Nasal Swab  Result Value Ref Range Status   SARS Coronavirus 2 by RT PCR NEGATIVE NEGATIVE Final    Comment: (NOTE) SARS-CoV-2 target nucleic acids are NOT DETECTED.  The SARS-CoV-2 RNA is generally detectable in upper and lower respiratory specimens during the acute phase of infection. The lowest concentration of SARS-CoV-2 viral copies this assay can detect is 250 copies / mL. A negative result does not preclude SARS-CoV-2 infection and should not be used as the sole basis for treatment or other patient management decisions.  A negative result may occur with improper specimen collection / handling, submission of specimen other than nasopharyngeal swab, presence of viral mutation(s) within the areas targeted by this assay, and inadequate number of viral copies (<250 copies / mL). A negative result must be combined with clinical observations, patient history, and epidemiological information.  Fact Sheet for Patients:   RoadLapTop.co.za  Fact Sheet for Healthcare Providers: http://kim-miller.com/  This test is not yet approved or  cleared by the [place] FDA and has been authorized for detection and/or diagnosis of SARS-CoV-2 by FDA under an Emergency Use Authorization (EUA).  This EUA will remain in effect (meaning this test can be used) for the duration of the COVID-19 declaration under Section 564(b)(1) of the Act, 21 U.S.C. section 360bbb-3(b)(1), unless the authorization is terminated or revoked sooner.  Performed at [facility],  [address] [place], [place] [address]   Culture, blood (single)     Status: Abnormal   Collection Time: 05/14/22 11:43 AM   Specimen: BLOOD RIGHT ARM  Result Value Ref Range Status   Specimen Description   Final    BLOOD RIGHT ARM BOTTLES DRAWN AEROBIC AND ANAEROBIC Performed at [facility], [address], [place], [place] [address]    Special Requests   Final    Blood Culture results may not be optimal due to an excessive volume of blood received in culture bottles Performed at [facility], [address], [place], [place] [address]    Culture  Setup Time   Final    GRAM NEGATIVE RODS Gram Stain Report Called to,Read Back By and Verified With:  V. DILDY @ 1709 BY STEPHTR 05/15/22 Organism ID to follow CRITICAL RESULT CALLED TO, READ BACK BY AND VERIFIED WITH: RN Nyra Capes [id number] AT 2120 BY EC Performed at [facility], [address], [place], [place] [address]    Culture ESCHERICHIA COLI (A)  Final  Report Status 05/17/2022 FINAL  Final   Organism ID, Bacteria ESCHERICHIA COLI  Final      Susceptibility   Escherichia coli - MIC*    AMPICILLIN <=2 SENSITIVE Sensitive     CEFEPIME <=0.12 SENSITIVE Sensitive     CEFTAZIDIME <=1 SENSITIVE Sensitive     CEFTRIAXONE <=0.25 SENSITIVE Sensitive     CIPROFLOXACIN <=0.25 SENSITIVE Sensitive     GENTAMICIN <=1 SENSITIVE Sensitive     IMIPENEM <=0.25 SENSITIVE Sensitive     TRIMETH/SULFA <=20 SENSITIVE Sensitive     AMPICILLIN/SULBACTAM <=2 SENSITIVE Sensitive     PIP/TAZO <=4 SENSITIVE Sensitive     * ESCHERICHIA COLI  Blood Culture ID Panel (Reflexed)     Status: Abnormal   Collection Time: 05/14/22 11:43 AM  Result Value Ref Range Status   Enterococcus faecalis NOT DETECTED NOT DETECTED Final   Enterococcus Faecium NOT DETECTED NOT DETECTED Final   Listeria monocytogenes NOT DETECTED NOT DETECTED Final   Staphylococcus species NOT DETECTED NOT DETECTED Final   Staphylococcus aureus (BCID) NOT DETECTED NOT DETECTED  Final   Staphylococcus epidermidis NOT DETECTED NOT DETECTED Final   Staphylococcus lugdunensis NOT DETECTED NOT DETECTED Final   Streptococcus species NOT DETECTED NOT DETECTED Final   Streptococcus agalactiae NOT DETECTED NOT DETECTED Final   Streptococcus pneumoniae NOT DETECTED NOT DETECTED Final   Streptococcus pyogenes NOT DETECTED NOT DETECTED Final   A.calcoaceticus-baumannii NOT DETECTED NOT DETECTED Final   Bacteroides fragilis NOT DETECTED NOT DETECTED Final   Enterobacterales DETECTED (A) NOT DETECTED Final    Comment: Enterobacterales represent a large order of gram negative bacteria, not a single organism.   Enterobacter cloacae complex NOT DETECTED NOT DETECTED Final   Escherichia coli DETECTED (A) NOT DETECTED Final    Comment: CRITICAL RESULT CALLED TO, READ BACK BY AND VERIFIED WITH: RN Autumn Patty [id number] AT 2120 BY EC    Klebsiella aerogenes NOT DETECTED NOT DETECTED Final   Klebsiella oxytoca NOT DETECTED NOT DETECTED Final   Klebsiella pneumoniae NOT DETECTED NOT DETECTED Final   Proteus species NOT DETECTED NOT DETECTED Final   Salmonella species NOT DETECTED NOT DETECTED Final   Serratia marcescens NOT DETECTED NOT DETECTED Final   Haemophilus influenzae NOT DETECTED NOT DETECTED Final   Neisseria meningitidis NOT DETECTED NOT DETECTED Final   Pseudomonas aeruginosa NOT DETECTED NOT DETECTED Final   Stenotrophomonas maltophilia NOT DETECTED NOT DETECTED Final   Candida albicans NOT DETECTED NOT DETECTED Final   Candida auris NOT DETECTED NOT DETECTED Final   Candida glabrata NOT DETECTED NOT DETECTED Final   Candida krusei NOT DETECTED NOT DETECTED Final   Candida parapsilosis NOT DETECTED NOT DETECTED Final   Candida tropicalis NOT DETECTED NOT DETECTED Final   Cryptococcus neoformans/gattii NOT DETECTED NOT DETECTED Final   CTX-M ESBL NOT DETECTED NOT DETECTED Final   Carbapenem resistance IMP NOT DETECTED NOT DETECTED Final   Carbapenem resistance  KPC NOT DETECTED NOT DETECTED Final   Carbapenem resistance NDM NOT DETECTED NOT DETECTED Final   Carbapenem resist OXA 48 LIKE NOT DETECTED NOT DETECTED Final   Carbapenem resistance VIM NOT DETECTED NOT DETECTED Final    Comment: Performed at [facility], [address], [place], [place] [address]  Urine Culture     Status: Abnormal (Preliminary result)   Collection Time: 05/14/22  2:51 PM   Specimen: Urine, Clean Catch  Result Value Ref Range Status   Specimen Description   Final    URINE, CLEAN CATCH Performed  at [facility], [address], [place], [place] [address]    Special Requests   Final    NONE Performed at [facility] [address], [place], [place] [address]    Culture 10,000 COLONIES/mL ESCHERICHIA COLI (A)  Final   Report Status PENDING  Incomplete   Organism ID, Bacteria ESCHERICHIA COLI (A)  Final      Susceptibility   Escherichia coli - MIC*    AMPICILLIN <=2 SENSITIVE Sensitive     CEFAZOLIN <=4 SENSITIVE Sensitive     CEFEPIME <=0.12 SENSITIVE Sensitive     CEFTRIAXONE <=0.25 SENSITIVE Sensitive     CIPROFLOXACIN <=0.25 SENSITIVE Sensitive     GENTAMICIN <=1 SENSITIVE Sensitive     IMIPENEM <=0.25 SENSITIVE Sensitive     NITROFURANTOIN <=16 SENSITIVE Sensitive     TRIMETH/SULFA <=20 SENSITIVE Sensitive     AMPICILLIN/SULBACTAM <=2 SENSITIVE Sensitive     PIP/TAZO <=4 SENSITIVE Sensitive     * 10,000 COLONIES/mL ESCHERICHIA COLI         Radiology Studies: No results found.      Scheduled Meds:  enoxaparin (LOVENOX) injection  30 mg Subcutaneous Q24H   latanoprost  1 drop Right Eye QHS   levothyroxine  88 mcg Oral QAC breakfast   metoprolol succinate  50 mg Oral Daily   pantoprazole  40 mg Oral Daily   pravastatin  80 mg Oral Daily   sertraline  100 mg Oral Daily   Continuous Infusions:  cefTRIAXone (ROCEPHIN)  IV 2 g (05/17/22 1014)     LOS: 3 days    Time spent: 45 minutes spent on chart review, discussion with nursing  staff, consultants, updating family and interview/physical exam; more than 50% of that time was spent in counseling and/or coordination of care.    Joseph Art, DO [facility] Available via Epic secure chat 7am-7pm After these hours, please refer to coverage provider listed on [email or web address] 05/17/2022, 10:37 AM

## 2022-05-17 NOTE — Progress Notes (Signed)
Mobility Specialist Progress Note:    05/17/22 1100  Mobility  Activity Ambulated with assistance in hallway;Ambulated with assistance to bathroom;Transferred from bed to chair  Level of Assistance Contact guard assist, steadying assist  Assistive Device Front wheel walker  Distance Ambulated (ft) 75 ft  Activity Response Tolerated well  Mobility Referral Yes  $Mobility charge 1 Mobility  Mobility Specialist Start Time (ACUTE ONLY) 1050  Mobility Specialist Stop Time (ACUTE ONLY) 1101  Mobility Specialist Time Calculation (min) (ACUTE ONLY) 11 min   Pt agreeable to mobility session, received in bed, family in room. Ambulated in hallway, to/from bathroom, and transferred to chair via RW. Left pt in chair, all needs met, family at bedside.   Feliciana Rossetti Mobility Specialist Please contact via Special educational needs teacher or  Rehab office at [phone]

## 2022-05-18 DIAGNOSIS — I5033 Acute on chronic diastolic (congestive) heart failure: Secondary | ICD-10-CM

## 2022-05-18 DIAGNOSIS — A4151 Sepsis due to Escherichia coli [E. coli]: Secondary | ICD-10-CM

## 2022-05-18 DIAGNOSIS — N10 Acute tubulo-interstitial nephritis: Secondary | ICD-10-CM | POA: Diagnosis not present

## 2022-05-18 DIAGNOSIS — N179 Acute kidney failure, unspecified: Secondary | ICD-10-CM | POA: Diagnosis not present

## 2022-05-18 LAB — BASIC METABOLIC PANEL
Anion gap: 7 (ref 5–15)
BUN: 17 mg/dL (ref 8–23)
CO2: 20 mmol/L — ABNORMAL LOW (ref 22–32)
Calcium: 8.4 mg/dL — ABNORMAL LOW (ref 8.9–10.3)
Chloride: 106 mmol/L (ref 98–111)
Creatinine, Ser: 0.94 mg/dL (ref 0.44–1.00)
GFR, Estimated: 59 mL/min — ABNORMAL LOW (ref >60)
Glucose, Bld: 112 mg/dL — ABNORMAL HIGH (ref 70–99)
Potassium: 4.1 mmol/L (ref 3.5–5.1)
Sodium: 133 mmol/L — ABNORMAL LOW (ref 135–145)

## 2022-05-18 LAB — CBC
HCT: 29.7 % — ABNORMAL LOW (ref 36.0–46.0)
Hemoglobin: 9.5 g/dL — ABNORMAL LOW (ref 12.0–15.0)
MCH: 30.5 pg (ref 26.0–34.0)
MCHC: 32 g/dL (ref 30.0–36.0)
MCV: 95.5 fL (ref 80.0–100.0)
Platelets: 180 10*3/uL (ref 150–400)
RBC: 3.11 MIL/uL — ABNORMAL LOW (ref 3.87–5.11)
RDW: 13.7 % (ref 11.5–15.5)
WBC: 6 10*3/uL (ref 4.0–10.5)
nRBC: 0 % (ref 0.0–0.2)

## 2022-05-18 LAB — BRAIN NATRIURETIC PEPTIDE: B Natriuretic Peptide: 802 pg/mL — ABNORMAL HIGH (ref 0.0–100.0)

## 2022-05-18 MED ORDER — CEFPODOXIME PROXETIL 200 MG PO TABS: 200.0000 mg | ORAL_TABLET | Freq: Two times a day (BID) | ORAL | 0 refills | Status: AC

## 2022-05-18 MED ORDER — FUROSEMIDE 20 MG PO TABS
20.0000 mg | ORAL_TABLET | Freq: Two times a day (BID) | ORAL | Status: DC
  Filled 2022-05-18: qty 1

## 2022-05-18 MED ORDER — FUROSEMIDE 20 MG PO TABS: 20.0000 mg | ORAL_TABLET | Freq: Two times a day (BID) | ORAL | 0 refills | Status: DC

## 2022-05-18 NOTE — Plan of Care (Signed)

## 2022-05-18 NOTE — Plan of Care (Signed)
  Problem: Education: Goal: Knowledge of General Education information will improve Description: Including pain rating scale, medication(s)/side effects and non-pharmacologic comfort measures 05/18/2022 1546 by Sheela Stack, RN Outcome: Adequate for Discharge 05/18/2022 1205 by Sheela Stack, RN Outcome: Progressing   Problem: Health Behavior/Discharge Planning: Goal: Ability to manage health-related needs will improve 05/18/2022 1546 by Sheela Stack, RN Outcome: Adequate for Discharge 05/18/2022 1205 by Sheela Stack, RN Outcome: Progressing   Problem: Clinical Measurements: Goal: Ability to maintain clinical measurements within normal limits will improve 05/18/2022 1546 by Sheela Stack, RN Outcome: Adequate for Discharge 05/18/2022 1205 by Sheela Stack, RN Outcome: Progressing Goal: Will remain free from infection 05/18/2022 1546 by Sheela Stack, RN Outcome: Adequate for Discharge 05/18/2022 1205 by Sheela Stack, RN Outcome: Progressing Goal: Diagnostic test results will improve 05/18/2022 1546 by Sheela Stack, RN Outcome: Adequate for Discharge 05/18/2022 1205 by Sheela Stack, RN Outcome: Progressing Goal: Respiratory complications will improve 05/18/2022 1546 by Sheela Stack, RN Outcome: Adequate for Discharge 05/18/2022 1205 by Sheela Stack, RN Outcome: Progressing Goal: Cardiovascular complication will be avoided 05/18/2022 1546 by Sheela Stack, RN Outcome: Adequate for Discharge 05/18/2022 1205 by Sheela Stack, RN Outcome: Progressing   Problem: Activity: Goal: Risk for activity intolerance will decrease 05/18/2022 1546 by Sheela Stack, RN Outcome: Adequate for Discharge 05/18/2022 1205 by Sheela Stack, RN Outcome: Progressing   Problem: Nutrition: Goal: Adequate nutrition will be maintained 05/18/2022 1546 by Sheela Stack, RN Outcome: Adequate for Discharge 05/18/2022 1205 by Sheela Stack, RN Outcome: Progressing   Problem:  Coping: Goal: Level of anxiety will decrease 05/18/2022 1546 by Sheela Stack, RN Outcome: Adequate for Discharge 05/18/2022 1205 by Sheela Stack, RN Outcome: Progressing   Problem: Elimination: Goal: Will not experience complications related to bowel motility 05/18/2022 1546 by Sheela Stack, RN Outcome: Adequate for Discharge 05/18/2022 1205 by Sheela Stack, RN Outcome: Progressing Goal: Will not experience complications related to urinary retention 05/18/2022 1546 by Sheela Stack, RN Outcome: Adequate for Discharge 05/18/2022 1205 by Sheela Stack, RN Outcome: Progressing   Problem: Pain Managment: Goal: General experience of comfort will improve 05/18/2022 1546 by Sheela Stack, RN Outcome: Adequate for Discharge 05/18/2022 1205 by Sheela Stack, RN Outcome: Progressing   Problem: Safety: Goal: Ability to remain free from injury will improve 05/18/2022 1546 by Sheela Stack, RN Outcome: Adequate for Discharge 05/18/2022 1205 by Sheela Stack, RN Outcome: Progressing   Problem: Skin Integrity: Goal: Risk for impaired skin integrity will decrease 05/18/2022 1546 by Sheela Stack, RN Outcome: Adequate for Discharge 05/18/2022 1205 by Sheela Stack, RN Outcome: Progressing

## 2022-05-18 NOTE — Progress Notes (Signed)
Mobility Specialist Progress Note:   05/18/22 0954  Mobility  Activity Ambulated with assistance in room  Level of Assistance Standby assist, set-up cues, supervision of patient - no hands on  Assistive Device Front wheel walker  Distance Ambulated (ft) 10 ft  Activity Response Tolerated well  Mobility Referral Yes  $Mobility charge 1 Mobility  Mobility Specialist Start Time (ACUTE ONLY) 0915  Mobility Specialist Stop Time (ACUTE ONLY) 0930  Mobility Specialist Time Calculation (min) (ACUTE ONLY) 15 min   Pt received ambulating in room via RW. Family in room supervising. Assisted pt back to bed after ADLs. Left pt with all needs met, call bell in reach, family in room.   Feliciana Rossetti Mobility Specialist Please contact via Special educational needs teacher or  Rehab office at [phone]

## 2022-05-18 NOTE — Discharge Summary (Signed)
Physician Discharge Summary   Patient: Aimee Ward MRN: [id number] DOB: 07/30/1933  Admit date:     05/14/2022  Discharge date: 05/18/2022  Discharge Physician: Hollice Espy   PCP: April Manson, NP   Recommendations at discharge:   New medication: Vantin 200 mg p.o. twice daily x 2 days New medication: Lasix 20 mg p.o. daily x 1 week Patient will follow-up with her PCP in the next 1 to 2 weeks.  At that time basic metabolic panel and BNP recommended to be checked Patient being set up with home health PT and OT  Discharge Diagnoses: Principal Problem:   Sepsis (HCC) Active Problems:   Aortic stenosis, moderate   HTN (hypertension), benign   Acute pyelonephritis   AKI (acute kidney injury) (HCC)   Hypothyroidism   Macular degeneration, bilateral   Hyponatremia   Dehydration  Resolved Problems:   * No resolved hospital problems. *  Hospital Course: Aimee Ward is a 89 y.o. female with medical history significant of hypertension, heart failure with preserved EF (grade 1 diastolic dysfunction), GERD, hypothyroidism, macular degeneration with significant vision loss, degenerative disc disease.  Patient was recently treated for UTI at the beginning of April.  She was treated with oral antibiotics and discharged to home from the emergency department with no culture done.  She states that she never fully recovered, but began to get worse again.  Found to have ecoli bacteremia.    Assessment and Plan: E. coli sepsis from pyelonephritis left-sided: Patient met criteria for sepsis on admission secondary to tachycardia and tachypnea with E. coli UTI with pyelonephritis as source.  Patient treated aggressively with IV fluids and Rocephin.  Urine cultures grew out pansensitive E. coli.  Sepsis resolved.  Patient completed 5 days of IV Rocephin and discharged on 2 more days of p.o. Vantin.   Hyponatremia secondary to dehydration, metabolic acidosis secondary to infection By  day of discharge, sodium at 133   AKI: Normalized with IV fluids.  By day of discharge, creatinine 0.94  Acute on chronic diastolic heart failure:  By day of discharge, BNP at 802.  Patient not tachycardic or tachypneic.  Not hypoxic.  She is not on a diuretic normally.  I discussed this with the patient and her daughter and they prefer to go home and so patient started on oral Lasix x 1 week.  She will follow-up with her PCP in the next 1 to 2 weeks and at that time, repeat basic metabolic panel and BNP to be checked.   Hypothyroidism -Continue levothyroxine   PT eval-- lives at home with daughter-- recommend home health        Consultants: None Procedures performed: None Disposition: Home Diet recommendation:  Discharge Diet Orders (From admission, onward)     Start     Ordered   05/18/22 0000  Diet - low sodium heart healthy        05/18/22 1529           Cardiac diet DISCHARGE MEDICATION: Allergies as of 05/18/2022       Reactions   Lisinopril Itching   itching Other   Sulfa Antibiotics Anaphylaxis   Ciprofloxacin Hcl    Other reaction(s): Other Muscle pain   Nitrofurantoin Itching   Other Other   Hydrochlorothiazide Other (See Comments)   Cramps Cramps Cramps Other        Medication List     TAKE these medications    acetic acid-hydrocortisone OTIC solution Commonly known as:  VOSOL-HC PLEASE SEE ATTACHED FOR DETAILED DIRECTIONS   ALPRAZolam 0.5 MG tablet Commonly known as: XANAX Take 0.5 mg by mouth daily as needed for anxiety or sleep. Take 1/2 to 1 tablet daily as needed   ascorbic acid 500 MG tablet Commonly known as: VITAMIN C Take 500 mg by mouth daily.   atropine 1 % ophthalmic solution INSTILL 1 DROP INTO LEFT EYE TWICE A DAY What changed: See the new instructions.   cefpodoxime 200 MG tablet Commonly known as: VANTIN Take 1 tablet (200 mg total) by mouth 2 (two) times daily for 2 days.   cholecalciferol 25 MCG (1000 UNIT)  tablet Commonly known as: VITAMIN D3 Take 1,000 Units by mouth daily.   dimenhyDRINATE 50 MG tablet Commonly known as: DRAMAMINE Take 50 mg by mouth every 8 (eight) hours as needed for nausea.   ESTRACE VAGINAL 0.1 MG/GM vaginal cream Generic drug: estradiol Apply 1 application topically every other day.   furosemide 20 MG tablet Commonly known as: LASIX Take 1 tablet (20 mg total) by mouth 2 (two) times daily.   HYDROcodone-acetaminophen 5-325 MG tablet Commonly known as: NORCO/VICODIN Take 1 tablet by mouth every 6 (six) hours as needed.   hydrocortisone 2.5 % cream Apply 1 application topically 2 (two) times daily as needed.   latanoprost 0.005 % ophthalmic solution Commonly known as: XALATAN Place 1 drop into the right eye at bedtime.   levothyroxine 88 MCG tablet Commonly known as: SYNTHROID Take 88 mcg by mouth daily before breakfast.   lidocaine 2 % solution Commonly known as: XYLOCAINE Take 5 mLs by mouth 4 (four) times daily as needed.   loratadine 10 MG tablet Commonly known as: CLARITIN Take 10 mg by mouth daily.   methocarbamol 750 MG tablet Commonly known as: ROBAXIN Take 750 mg by mouth every 6 (six) hours as needed for muscle spasms.   metoprolol succinate 50 MG 24 hr tablet Commonly known as: TOPROL-XL Take 50 mg by mouth daily. Take with or immediately following a meal.   pantoprazole 40 MG tablet Commonly known as: PROTONIX Take 40 mg by mouth daily.   pravastatin 80 MG tablet Commonly known as: PRAVACHOL Take 80 mg by mouth daily.   PreserVision AREDS Caps Take 1 capsule by mouth daily.   sertraline 100 MG tablet Commonly known as: ZOLOFT Take 100 mg by mouth daily.        Follow-up Information     [place], [facility] Follow up.   Why: PT/OT will call to scheudule your first home visit. Contact information: [address] [place] [address] [phone]                Discharge Exam: There  were no vitals filed for this visit. General: Alert and oriented x 3, no acute distress Cardiovascular: Regular rate and rhythm, S1-S2 Lungs: Clear to auscultation bilaterally  Condition at discharge: improving  The results of significant diagnostics from this hospitalization (including imaging, microbiology, ancillary and laboratory) are listed below for reference.   Imaging Studies: CT ABDOMEN PELVIS WO CONTRAST  Result Date: 05/14/2022 CLINICAL DATA:  Abdominal pain, acute, nonlocalized EXAM: CT ABDOMEN AND PELVIS WITHOUT CONTRAST TECHNIQUE: Multidetector CT imaging of the abdomen and pelvis was performed following the standard protocol without IV contrast. RADIATION DOSE REDUCTION: This exam was performed according to the departmental dose-optimization program which includes automated exposure control, adjustment of the mA and/or kV according to patient size and/or use of iterative reconstruction technique. COMPARISON:  December 28, 2018 FINDINGS: Evaluation is limited by lack of IV contrast and motion. Lower chest: No acute abnormality. Hepatobiliary: The gallbladder is distended. Cholelithiasis. No new extrahepatic biliary ductal dilation. No new focal hepatic lesion. There is a small amount of fluid along the inferior aspect of the gallbladder which lays immediately adjacent to the RIGHT kidney. Pancreas: No peripancreatic fat stranding. Spleen: Unremarkable. Adrenals/Urinary Tract: Adrenal glands are unremarkable. No hydronephrosis or obstructing nephrolithiasis. Bladder is unremarkable. There is new fat stranding adjacent to the RIGHT kidney with a small amount of adjacent fluid in the pericolic gutter and retroperitoneal space. Stomach/Bowel: No evidence of bowel obstruction. Severe diverticulosis most pronounced in the sigmoid and descending colon. No evidence of acute diverticulitis. Appendix is normal. Stomach is decompressed. Vascular/Lymphatic: Atherosclerotic calcifications of the  nonaneurysmal abdominal aorta. No new lymphadenopathy is identified. Reproductive: Uterus and bilateral adnexa are unremarkable. Other: Small fat containing periumbilical hernia. Musculoskeletal: Status post posterior fixation of the lower lumbar spine. IMPRESSION: 1. There is new fat stranding in the RIGHT upper quadrant. It is centered predominantly adjacent to the RIGHT kidney with trace fluid tracking in the RIGHT pericolic gutter along the gallbladder inferiorly as well as posterior to the RIGHT kidney. However, gallbladder is also distended with multiple layering cholelithiasis and lays immediately adjacent to the RIGHT kidney. Constellation of findings are favored to reflect sequela of urinary tract infection such as pyelonephritis but differential considerations include early acute cholecystitis. Recommend correlation with physical exam and urinalysis. Aortic Atherosclerosis (ICD10-I70.0). Electronically Signed   By: Meda Klinefelter M.D.   On: 05/14/2022 15:20   DG Chest Port 1 View  Result Date: 05/14/2022 CLINICAL DATA:  Generalized weakness.  Vomiting. EXAM: PORTABLE CHEST 1 VIEW COMPARISON:  11/24/2004. FINDINGS: Cardiac silhouette is normal in size. No mediastinal or hilar masses. Lungs are clear.  No pleural effusion or pneumothorax. Skeletal structures are demineralized, grossly intact. IMPRESSION: No active disease. Electronically Signed   By: Amie Portland M.D.   On: 05/14/2022 14:56    Microbiology: Results for orders placed or performed during the hospital encounter of 05/14/22  SARS Coronavirus 2 by RT PCR (hospital order, performed in [facility] hospital lab) *cepheid single result test* Anterior Nasal Swab     Status: None   Collection Time: 05/14/22 11:38 AM   Specimen: Anterior Nasal Swab  Result Value Ref Range Status   SARS Coronavirus 2 by RT PCR NEGATIVE NEGATIVE Final    Comment: (NOTE) SARS-CoV-2 target nucleic acids are NOT DETECTED.  The SARS-CoV-2 RNA is generally  detectable in upper and lower respiratory specimens during the acute phase of infection. The lowest concentration of SARS-CoV-2 viral copies this assay can detect is 250 copies / mL. A negative result does not preclude SARS-CoV-2 infection and should not be used as the sole basis for treatment or other patient management decisions.  A negative result may occur with improper specimen collection / handling, submission of specimen other than nasopharyngeal swab, presence of viral mutation(s) within the areas targeted by this assay, and inadequate number of viral copies (<250 copies / mL). A negative result must be combined with clinical observations, patient history, and epidemiological information.  Fact Sheet for Patients:   RoadLapTop.co.za  Fact Sheet for Healthcare Providers: http://kim-miller.com/  This test is not yet approved or  cleared by the [place] FDA and has been authorized for detection and/or diagnosis of SARS-CoV-2 by FDA under an Emergency Use Authorization (EUA).  This EUA will remain in effect (meaning this  test can be used) for the duration of the COVID-19 declaration under Section 564(b)(1) of the Act, 21 U.S.C. section 360bbb-3(b)(1), unless the authorization is terminated or revoked sooner.  Performed at [facility], [address] [place], [place] [address]   Culture, blood (single)     Status: Abnormal   Collection Time: 05/14/22 11:43 AM   Specimen: BLOOD RIGHT ARM  Result Value Ref Range Status   Specimen Description   Final    BLOOD RIGHT ARM BOTTLES DRAWN AEROBIC AND ANAEROBIC Performed at [facility], [address] [place], [place] [address]    Special Requests   Final    Blood Culture results may not be optimal due to an excessive volume of blood received in culture bottles Performed at [facility], [address], [place], [place] [address]    Culture  Setup Time   Final    GRAM NEGATIVE  RODS Gram Stain Report Called to,Read Back By and Verified With:  V. DILDY @ 1709 BY STEPHTR 05/15/22 Organism ID to follow CRITICAL RESULT CALLED TO, READ BACK BY AND VERIFIED WITH: RN Nyra Capes [id number] AT 2120 BY EC Performed at [facility], [address] [place], [place] [address]    Culture ESCHERICHIA COLI (A)  Final   Report Status 05/17/2022 FINAL  Final   Organism ID, Bacteria ESCHERICHIA COLI  Final      Susceptibility   Escherichia coli - MIC*    AMPICILLIN <=2 SENSITIVE Sensitive     CEFEPIME <=0.12 SENSITIVE Sensitive     CEFTAZIDIME <=1 SENSITIVE Sensitive     CEFTRIAXONE <=0.25 SENSITIVE Sensitive     CIPROFLOXACIN <=0.25 SENSITIVE Sensitive     GENTAMICIN <=1 SENSITIVE Sensitive     IMIPENEM <=0.25 SENSITIVE Sensitive     TRIMETH/SULFA <=20 SENSITIVE Sensitive     AMPICILLIN/SULBACTAM <=2 SENSITIVE Sensitive     PIP/TAZO <=4 SENSITIVE Sensitive     * ESCHERICHIA COLI  Blood Culture ID Panel (Reflexed)     Status: Abnormal   Collection Time: 05/14/22 11:43 AM  Result Value Ref Range Status   Enterococcus faecalis NOT DETECTED NOT DETECTED Final   Enterococcus Faecium NOT DETECTED NOT DETECTED Final   Listeria monocytogenes NOT DETECTED NOT DETECTED Final   Staphylococcus species NOT DETECTED NOT DETECTED Final   Staphylococcus aureus (BCID) NOT DETECTED NOT DETECTED Final   Staphylococcus epidermidis NOT DETECTED NOT DETECTED Final   Staphylococcus lugdunensis NOT DETECTED NOT DETECTED Final   Streptococcus species NOT DETECTED NOT DETECTED Final   Streptococcus agalactiae NOT DETECTED NOT DETECTED Final   Streptococcus pneumoniae NOT DETECTED NOT DETECTED Final   Streptococcus pyogenes NOT DETECTED NOT DETECTED Final   A.calcoaceticus-baumannii NOT DETECTED NOT DETECTED Final   Bacteroides fragilis NOT DETECTED NOT DETECTED Final   Enterobacterales DETECTED (A) NOT DETECTED Final    Comment: Enterobacterales represent a large order of gram  negative bacteria, not a single organism.   Enterobacter cloacae complex NOT DETECTED NOT DETECTED Final   Escherichia coli DETECTED (A) NOT DETECTED Final    Comment: CRITICAL RESULT CALLED TO, READ BACK BY AND VERIFIED WITH: RN Autumn Patty [id number] AT 2120 BY EC    Klebsiella aerogenes NOT DETECTED NOT DETECTED Final   Klebsiella oxytoca NOT DETECTED NOT DETECTED Final   Klebsiella pneumoniae NOT DETECTED NOT DETECTED Final   Proteus species NOT DETECTED NOT DETECTED Final   Salmonella species NOT DETECTED NOT DETECTED Final   Serratia marcescens NOT DETECTED NOT DETECTED Final  Haemophilus influenzae NOT DETECTED NOT DETECTED Final   Neisseria meningitidis NOT DETECTED NOT DETECTED Final   Pseudomonas aeruginosa NOT DETECTED NOT DETECTED Final   Stenotrophomonas maltophilia NOT DETECTED NOT DETECTED Final   Candida albicans NOT DETECTED NOT DETECTED Final   Candida auris NOT DETECTED NOT DETECTED Final   Candida glabrata NOT DETECTED NOT DETECTED Final   Candida krusei NOT DETECTED NOT DETECTED Final   Candida parapsilosis NOT DETECTED NOT DETECTED Final   Candida tropicalis NOT DETECTED NOT DETECTED Final   Cryptococcus neoformans/gattii NOT DETECTED NOT DETECTED Final   CTX-M ESBL NOT DETECTED NOT DETECTED Final   Carbapenem resistance IMP NOT DETECTED NOT DETECTED Final   Carbapenem resistance KPC NOT DETECTED NOT DETECTED Final   Carbapenem resistance NDM NOT DETECTED NOT DETECTED Final   Carbapenem resist OXA 48 LIKE NOT DETECTED NOT DETECTED Final   Carbapenem resistance VIM NOT DETECTED NOT DETECTED Final    Comment: Performed at [facility], [address], [place], [place] [address]  Urine Culture     Status: Abnormal   Collection Time: 05/14/22  2:51 PM   Specimen: Urine, Clean Catch  Result Value Ref Range Status   Specimen Description   Final    URINE, CLEAN CATCH Performed at [facility], [address], [place], [place] [address]    Special Requests    Final    NONE Performed at [facility], [address], [place], [place] [address]    Culture 10,000 COLONIES/mL ESCHERICHIA COLI (A)  Final   Report Status 05/17/2022 FINAL  Final   Organism ID, Bacteria ESCHERICHIA COLI (A)  Final      Susceptibility   Escherichia coli - MIC*    AMPICILLIN <=2 SENSITIVE Sensitive     CEFAZOLIN <=4 SENSITIVE Sensitive     CEFEPIME <=0.12 SENSITIVE Sensitive     CEFTRIAXONE <=0.25 SENSITIVE Sensitive     CIPROFLOXACIN <=0.25 SENSITIVE Sensitive     GENTAMICIN <=1 SENSITIVE Sensitive     IMIPENEM <=0.25 SENSITIVE Sensitive     NITROFURANTOIN <=16 SENSITIVE Sensitive     TRIMETH/SULFA <=20 SENSITIVE Sensitive     AMPICILLIN/SULBACTAM <=2 SENSITIVE Sensitive     PIP/TAZO <=4 SENSITIVE Sensitive     * 10,000 COLONIES/mL ESCHERICHIA COLI    Labs: CBC: Recent Labs  Lab 05/14/22 1240 05/15/22 0445 05/16/22 0425 05/17/22 0410 05/18/22 0440  WBC 8.4 7.1 5.5 4.4 6.0  HGB 11.4* 9.4* 9.2* 9.6* 9.5*  HCT 35.3* 29.7* 29.2* 30.4* 29.7*  MCV 94.9 97.1 96.7 96.8 95.5  PLT 119* 108* 130* 128* 180   Basic Metabolic Panel: Recent Labs  Lab 05/14/22 1240 05/15/22 0445 05/16/22 0425 05/17/22 0410 05/18/22 0440  NA 128* 131* 133* 133* 133*  K 4.3 4.5 4.6 4.5 4.1  CL 100 107 110 107 106  CO2 18* 16* 17* 20* 20*  GLUCOSE 113* 95 102* 108* 112*  BUN 41* 34* 25* 18 17  CREATININE 1.92* 1.47* 1.19* 1.04* 0.94  CALCIUM 8.1* 7.7* 8.0* 8.4* 8.4*   Liver Function Tests: Recent Labs  Lab 05/14/22 1240  AST 18  ALT 11  ALKPHOS 67  BILITOT 1.0  PROT 7.5  ALBUMIN 3.1*   CBG: Recent Labs  Lab 05/14/22 1902  GLUCAP 102*    Discharge time spent: greater than 30 minutes.  Signed: Hollice Espy, MD [facility] 05/20/2022

## 2022-05-18 NOTE — TOC Transition Note (Signed)
Transition of Care [facility]) - CM/SW Discharge Note   Patient Details  Name: Aimee Ward MRN: [id number] Date of Birth: [birth year]  [facility] CM/SW Contact:  Karn Cassis, LCSW Phone Number: 05/18/2022, 3:42 PM   Clinical Narrative:  Pt d/c today. Morrie Sheldon with [facility] notified. Home health orders in. RN updated.      Final next level of care: Home w Home Health Services Barriers to Discharge: Barriers Resolved   Patient Goals and CMS Choice CMS Medicare.gov Compare Post Acute Care list provided to:: Patient Represenative (must comment) Choice offered to / list presented to : Adult Children  Discharge Placement                         Discharge Plan and Services Additional resources added to the After Visit Summary for   In-house Referral: Clinical Social Work   Post Acute Care Choice: Home Health                    HH Arranged: PT, OT [facility] Agency: [facility] Date HH Agency Contacted: 05/17/22   Representative spoke with at [facility] Agency: Morrie Sheldon  Social Determinants of Health (SDOH) Interventions SDOH Screenings   Tobacco Use: Low Risk  (05/14/2022)     Readmission Risk Interventions     No data to display

## 2022-05-29 NOTE — Progress Notes (Unsigned)
Cardiology Clinic Note   Date: 05/30/2022 ID: [name], [name] 11-06-1933, MRN [id number]  Primary Cardiologist:  Peter Swaziland, MD  Patient Profile    Aimee Ward is a 89 y.o. female who presents to the clinic today for hospital follow-up.  Past medical history significant for: Aortic stenosis. Echo 01/19/2021: EF 60 to 65%.  Severe LVH.  Grade I DD.  Moderate MAC.  Moderate aortic valve stenosis, mean gradient 20.5 mmHg. Palpitations. Cardiac monitor 11/08/2017: NSR, rare PVCs. Hypertension. Hyperlipidemia. Hypothyroidism. Glaucoma.   History of Present Illness    Aimee Ward was first evaluated by Dr. Swaziland on 02/22/2011 for dyspnea at the request of Dr. Collins [place].  Echo showed normal LV function, Grade I DD, mild aortic valve stenosis.  She had a normal nuclear stress test.  She continues to be followed by Dr. Swaziland for the above outlined history.  Patient was last seen in the office by Azalee Course, PA-C on 03/03/2020 for follow-up.  Well at that time and no medication changes were made.  Patient had recent hospital admission from 05/14/2022 to 05/18/2022 for E. coli bacteremia.  She was treated with IV antibiotics.  On day of discharge BNP was elevated to 802.  She was instructed to follow-up with PCP.  Today, patient is accompanied by her daughter and great granddaughter.  She reports continued nausea and weakness since hospital discharge.  She feels palpitations are worse feeling like her heart is racing both at rest and with exertion.  She also reports worsening DOE since hospital discharge.  She denies lower extremity edema.  She had abdominal fullness/bloating when she was discharged from the hospital but it has since resolved since taking course of Lasix prescribed prior to discharge from hospital.  She reports back pain that was an issue prior to going into the hospital.  No exertional chest pain.    ROS: All other systems reviewed and are otherwise negative except  as noted in History of Present Illness.  Studies Reviewed    ECG is not ordered today.         Physical Exam    VS:  BP 134/80 (BP Location: Left Arm, Patient Position: Sitting, Cuff Size: Large)   Pulse 84   Ht 5\' 3"  (1.6 m)   Wt 147 lb 12.8 oz (67 kg)   SpO2 96%   BMI 26.18 kg/m  , BMI Body mass index is 26.18 kg/m.  GEN: Well nourished, well developed, in no acute distress. Neck: No JVD or carotid bruits. Cardiac: RRR.  3/6 systolic murmur. No rubs or gallops.   Respiratory:  Respirations regular and unlabored. Clear to auscultation without rales, wheezing or rhonchi. GI: Soft, nontender, nondistended. Extremities: Radials/DP/PT 2+ and equal bilaterally. No clubbing or cyanosis. No edema.  Skin: Warm and dry, no rash. Neuro: Strength intact.  Assessment & Plan    Aortic stenosis/DOE.  Echo January 2023 showed normal LV function, moderate aortic stenosis.  Patient reports increased DOE that seems to be getting worse since discharge from hospital on 05/18/2022.  She denies lower extremity edema, abdominal fullness/bloating, or early satiety.  Will get repeat echo for further evaluation. Medication management.  Patient was provided with a course of Lasix at hospital discharge secondary to elevated BNP.  Abdominal fullness/bloating has resolved since finishing course.  Will check BMP today. Palpitations.  Cardiac monitor October 2019 showed normal sinus rhythm with rare PVCs.  Patient reports increased palpitations since being discharged from the hospital.  She describes  feeling her heart racing with or without exertion that can last anywhere from minutes to hours.  Will get 14-day ZIO for further evaluation.  CBC today.  Continue metoprolol.  Disposition: Echo for aortic stenosis.  14-day ZIO for increased palpitations.  CBC and BMP today.  Follow-up in 10 to 12 weeks or sooner as needed.         Signed, Etta Grandchild. Charrise Lardner, DNP, NP-C

## 2022-05-30 ENCOUNTER — Ambulatory Visit: Payer: Medicare Other | Attending: Student | Admitting: Student

## 2022-05-30 ENCOUNTER — Encounter: Payer: Self-pay | Admitting: Student

## 2022-05-30 ENCOUNTER — Ambulatory Visit (INDEPENDENT_AMBULATORY_CARE_PROVIDER_SITE_OTHER): Payer: Medicare Other

## 2022-05-30 VITALS — BP 134/80 | HR 84 | Ht 63.0 in | Wt 147.8 lb

## 2022-05-30 DIAGNOSIS — R002 Palpitations: Secondary | ICD-10-CM | POA: Diagnosis present

## 2022-05-30 DIAGNOSIS — I350 Nonrheumatic aortic (valve) stenosis: Secondary | ICD-10-CM | POA: Diagnosis present

## 2022-05-30 DIAGNOSIS — Z79899 Other long term (current) drug therapy: Secondary | ICD-10-CM

## 2022-05-30 DIAGNOSIS — R0609 Other forms of dyspnea: Secondary | ICD-10-CM | POA: Diagnosis present

## 2022-05-30 LAB — BASIC METABOLIC PANEL

## 2022-05-30 LAB — CBC
MCV: 93 fL (ref 79–97)
Platelets: 323 10*3/uL (ref 150–450)
RDW: 13 % (ref 11.7–15.4)

## 2022-05-30 NOTE — Progress Notes (Unsigned)
Enrolled for [employer] to mail a ZIO XT long term holter monitor to the patients address on file.   Dr. Croitoru to read. 

## 2022-05-30 NOTE — Patient Instructions (Signed)
Medication Instructions:  Your physician recommends that you continue on your current medications as directed. Please refer to the Current Medication list given to you today.  *If you need a refill on your cardiac medications before your next appointment, please call your pharmacy*   Lab Work: Your physician recommends that you have the following labs drawn: CBC and BMET  If you have labs (blood work) drawn today and your tests are completely normal, you will receive your results only by: MyChart Message (if you have MyChart) OR A paper copy in the mail If you have any lab test that is abnormal or we need to change your treatment, we will call you to review the results.   Testing/Procedures: Your physician has requested that you have an echocardiogram. Echocardiography is a painless test that uses sound waves to create images of your heart. It provides your doctor with information about the size and shape of your heart and how well your heart's chambers and valves are working. This procedure takes approximately one hour. There are no restrictions for this procedure. Please do NOT wear cologne, perfume, aftershave, or lotions (deodorant is allowed). Please arrive 15 minutes prior to your appointment time.  ZIO XT- Long Term Monitor Instructions  Your physician has requested you wear a ZIO patch monitor for 14 days.  This is a single patch monitor. [employer] supplies one patch monitor per enrollment. Additional stickers are not available. Please do not apply patch if you will be having a Nuclear Stress Test,  Echocardiogram, Cardiac CT, MRI, or Chest Xray during the period you would be wearing the  monitor. The patch cannot be worn during these tests. You cannot remove and re-apply the  ZIO XT patch monitor.  Your ZIO patch monitor will be mailed 3 day USPS to your address on file. It may take 3-5 days  to receive your monitor after you have been enrolled.  Once you have received your  monitor, please review the enclosed instructions. Your monitor  has already been registered assigning a specific monitor serial # to you.  Billing and Patient Assistance Program Information  We have supplied [employer] with any of your insurance information on file for billing purposes. [employer] offers a sliding scale Patient Assistance Program for patients that do not have  insurance, or whose insurance does not completely cover the cost of the ZIO monitor.  You must apply for the Patient Assistance Program to qualify for this discounted rate.  To apply, please call [employer] at [phone], select option 4, select option 2, ask to apply for  Patient Assistance Program. Meredeth Ide will ask your household income, and how many people  are in your household. They will quote your out-of-pocket cost based on that information.  [employer] will also be able to set up a 33-month, interest-free payment plan if needed.  Applying the monitor   Shave hair from upper left chest.  Hold abrader disc by orange tab. Rub abrader in 40 strokes over the upper left chest as  indicated in your monitor instructions.  Clean area with 4 enclosed alcohol pads. Let dry.  Apply patch as indicated in monitor instructions. Patch will be placed under collarbone on left  side of chest with arrow pointing upward.  Rub patch adhesive wings for 2 minutes. Remove white label marked "1". Remove the white  label marked "2". Rub patch adhesive wings for 2 additional minutes.  While looking in a mirror, press and release button in center of patch. A small green  light will  flash 3-4 times. This will be your only indicator that the monitor has been turned on.  Do not shower for the first 24 hours. You may shower after the first 24 hours.  Press the button if you feel a symptom. You will hear a small click. Record Date, Time and  Symptom in the Patient Logbook.  When you are ready to remove the patch, follow instructions on the last 2  pages of Patient  Logbook. Stick patch monitor onto the last page of Patient Logbook.  Place Patient Logbook in the blue and white box. Use locking tab on box and tape box closed  securely. The blue and white box has prepaid postage on it. Please place it in the mailbox as  soon as possible. Your physician should have your test results approximately 7 days after the  monitor has been mailed back to [facility].  Call [facility] Customer Care at [phone] if you have questions regarding  your ZIO XT patch monitor. Call them immediately if you see an orange light blinking on your  monitor.  If your monitor falls off in less than 4 days, contact our Monitor department at [phone].  If your monitor becomes loose or falls off after 4 days call [employer] at [phone] for  suggestions on securing your monitor     Follow-Up: At [facility], you and your health needs are our priority.  As part of our continuing mission to provide you with exceptional heart care, we have created designated Provider Care Teams.  These Care Teams include your primary Cardiologist (physician) and Advanced Practice Providers (APPs -  Physician Assistants and Nurse Practitioners) who all work together to provide you with the care you need, when you need it.  We recommend signing up for the patient portal called "MyChart".  Sign up information is provided on this After Visit Summary.  MyChart is used to connect with patients for Virtual Visits (Telemedicine).  Patients are able to view lab/test results, encounter notes, upcoming appointments, etc.  Non-urgent messages can be sent to your provider as well.   To learn more about what you can do with MyChart, go to [email or web address].    Your next appointment:   10-12 week(s)  Provider:   Carlos Levering, NP

## 2022-05-31 LAB — CBC
Hematocrit: 36.6 % (ref 34.0–46.6)
Hemoglobin: 12.1 g/dL (ref 11.1–15.9)
MCH: 30.6 pg (ref 26.6–33.0)
MCHC: 33.1 g/dL (ref 31.5–35.7)
RBC: 3.95 x10E6/uL (ref 3.77–5.28)
WBC: 5.8 10*3/uL (ref 3.4–10.8)

## 2022-05-31 LAB — BASIC METABOLIC PANEL
BUN/Creatinine Ratio: 16 (ref 12–28)
BUN: 15 mg/dL (ref 8–27)
CO2: 22 mmol/L (ref 20–29)
Chloride: 98 mmol/L (ref 96–106)
Creatinine, Ser: 0.96 mg/dL (ref 0.57–1.00)
Glucose: 91 mg/dL (ref 70–99)
Potassium: 5.6 mmol/L — ABNORMAL HIGH (ref 3.5–5.2)
Sodium: 131 mmol/L — ABNORMAL LOW (ref 134–144)
eGFR: 58 mL/min/{1.73_m2} — ABNORMAL LOW (ref >59)

## 2022-06-01 ENCOUNTER — Other Ambulatory Visit: Payer: Self-pay | Admitting: *Deleted

## 2022-06-01 DIAGNOSIS — E875 Hyperkalemia: Secondary | ICD-10-CM

## 2022-06-03 ENCOUNTER — Ambulatory Visit (HOSPITAL_COMMUNITY): Payer: Medicare Other | Attending: Cardiology

## 2022-06-03 DIAGNOSIS — R002 Palpitations: Secondary | ICD-10-CM | POA: Diagnosis not present

## 2022-06-03 DIAGNOSIS — I350 Nonrheumatic aortic (valve) stenosis: Secondary | ICD-10-CM | POA: Diagnosis present

## 2022-06-03 LAB — ECHOCARDIOGRAM COMPLETE
AR max vel: 1.52 cm2
AV Area VTI: 1.71 cm2
AV Area mean vel: 1.52 cm2
AV Mean grad: 20.5 mmHg
AV Peak grad: 36.7 mmHg
Ao pk vel: 3.03 m/s
Area-P 1/2: 5.13 cm2
S' Lateral: 2.2 cm

## 2022-06-11 LAB — BASIC METABOLIC PANEL
BUN/Creatinine Ratio: 16 (ref 12–28)
BUN: 17 mg/dL (ref 8–27)
CO2: 20 mmol/L (ref 20–29)
Calcium: 9.5 mg/dL (ref 8.7–10.3)
Chloride: 99 mmol/L (ref 96–106)
Creatinine, Ser: 1.06 mg/dL — ABNORMAL HIGH (ref 0.57–1.00)
Glucose: 129 mg/dL — ABNORMAL HIGH (ref 70–99)
Potassium: 4.9 mmol/L (ref 3.5–5.2)
Sodium: 135 mmol/L (ref 134–144)
eGFR: 51 mL/min/{1.73_m2} — ABNORMAL LOW (ref >59)

## 2022-07-05 ENCOUNTER — Other Ambulatory Visit: Payer: Self-pay

## 2022-07-05 MED ORDER — METOPROLOL SUCCINATE ER 50 MG PO TB24: ORAL_TABLET | ORAL | 1 refills | Status: DC

## 2022-07-28 ENCOUNTER — Other Ambulatory Visit: Payer: Self-pay | Admitting: Student

## 2022-08-06 NOTE — Progress Notes (Unsigned)
Cardiology Clinic Note   Date: 08/08/2022 ID: Aimee, Ward May 04, 1933, MRN [id number]  Primary Cardiologist:  Peter [place], MD  Patient Profile    Aimee Ward is a 89 y.o. female who presents to the clinic today for follow up after cardiac testing.     Past medical history significant for: Aortic stenosis. Echo 06/03/2022: EF 70 to 75%.  Severe concentric LVH.  Grade I DD.  Normal RV function.  Mild LAE.  Moderate MAC.  Mild AI.  Moderate AS. Palpitations. 14-day ZIO 06/27/2022: Mildly abnormal arrhythmia monitor due to the occurrence of occasional brief episodes of nonsustained ectopic atrial tachycardia and occasional PVCs.  No evidence of V. tach or A-fib.  PVCs are substantially more frequent during expected sleep hours.  Consider association with OSA. Hypertension. Hyperlipidemia. Hypothyroidism. Glaucoma.     History of Present Illness    Aimee Ward was first evaluated by Dr. Swaziland on 02/22/2011 for dyspnea at the request of Dr. Collins [place]. Echo showed normal LV function, Grade I DD, mild aortic valve stenosis. She had a normal nuclear stress test. She continues to be followed by Dr. [place] for the above outlined history.   Patient was last seen in the office by me on 05/30/2022 for hospital follow-up.  She had undergone hospital admission x 4 days for E. coli bacteremia for which she was treated with IV antibiotics.  BNP was elevated at 802.  At the time of her visit she complained of continued nausea and weakness with worsening palpitations.  Palpitations described as racing with exertion and at rest.  She reported worsening DOE since hospital discharge with resolution of abdominal bloating/fullness since being discharged from the hospital.  Patient wore a 14-day ZIO which showed brief episodes of nonsustained ectopic atrial tachycardia and occasional PVCs noted more frequently during expected sleep hours.  She was instructed to take metoprolol 50 mg in the a.m. and  25 mg in the p.m.  Sleep study was suggested.  Echo showed normal LV/RV function, mild AI/moderate AS.  Today, patient is accompanied by her daughter. She is doing well. She reports infrequent palpitations and no further DOE. Patient denies shortness of breath. No chest pain, pressure, or tightness. Denies lower extremity edema, orthopnea, or PND. No recent falls. Patient has never been evaluated for sleep apnea. She reports her granddaughter told her she snores. She denies daytime somnolence. Stop Bang score 3. We will hold off on a sleep study for now.      ROS: All other systems reviewed and are otherwise negative except as noted in History of Present Illness.  Studies Reviewed    EKG is not ordered today.   Risk Assessment/Calculations        STOP-Bang Score:  3       Physical Exam    VS:  BP 126/84   Pulse 77   Ht 5\' 3"  (1.6 m)   Wt 148 lb 12.8 oz (67.5 kg)   SpO2 97%   BMI 26.36 kg/m  , BMI Body mass index is 26.36 kg/m.  GEN: Well nourished, well developed, in no acute distress. Neck: No JVD or carotid bruits. Cardiac:  RRR. 3/6 systolic murmur. No rubs or gallops.   Respiratory:  Respirations regular and unlabored. Clear to auscultation without rales, wheezing or rhonchi. GI: Soft, nontender, nondistended. Extremities: Radials/DP/PT 2+ and equal bilaterally. No clubbing or cyanosis. No edema.  Skin: Warm and dry, no rash. Neuro: Strength intact.  Assessment & Plan  Aortic stenosis.  Echo May 2024 showed normal LV function, moderate aortic stenosis.  Patient does not report lightheadedness, dizziness, presyncope or syncope. No lower extremity edema or DOE.  Palpitations.  14-day ZIO June 2024 showed brief episodes of nonsustained ectopic atrial tachycardia and occasional PVCs noted more frequently during expected sleep hours.  Patient was instructed to take metoprolol 50 mg in the a.m. and 25 mg in the p.m.  Patient reports occasional palpitations. She feels it has  improved since increasing metoprolol.  Hypertension: BP today 126/84.  Patient denies headaches, dizziness or vision changes. Continue Metoprolol.   Disposition: Return for previously scheduled visit with Dr. Sw[place]and on 10/14/2022 or sooner as needed.          Signed, Etta Grandchild. Aimee Mcgrady, DNP, NP-C

## 2022-08-08 ENCOUNTER — Encounter: Payer: Self-pay | Admitting: Student

## 2022-08-08 ENCOUNTER — Ambulatory Visit: Payer: Medicare Other | Attending: Student | Admitting: Student

## 2022-08-08 VITALS — BP 126/84 | HR 77 | Ht 63.0 in | Wt 148.8 lb

## 2022-08-08 DIAGNOSIS — R002 Palpitations: Secondary | ICD-10-CM | POA: Diagnosis present

## 2022-08-08 DIAGNOSIS — I10 Essential (primary) hypertension: Secondary | ICD-10-CM | POA: Diagnosis present

## 2022-08-08 DIAGNOSIS — I350 Nonrheumatic aortic (valve) stenosis: Secondary | ICD-10-CM | POA: Diagnosis present

## 2022-08-08 NOTE — Patient Instructions (Signed)
Medication Instructions:  Your physician recommends that you continue on your current medications as directed. Please refer to the Current Medication list given to you today. If you need a refill on your cardiac medications before your next appointment, please call your pharmacy.  Lab Work: If you have labs (blood work) drawn today and your tests are completely normal, you will receive your results only by: MyChart Message (if you have MyChart) OR A paper copy in the mail If you have any lab test that is abnormal or we need to change your treatment, we will call you to review the results.  Testing/Procedures: none  Follow-Up: At [facility], you and your health needs are our priority.  As part of our continuing mission to provide you with exceptional heart care, we have created designated Provider Care Teams.  These Care Teams include your primary Cardiologist (physician) and Advanced Practice Providers (APPs -  Physician Assistants and Nurse Practitioners) who all work together to provide you with the care you need, when you need it.  We recommend signing up for the patient portal called "MyChart".  Sign up information is provided on this After Visit Summary.  MyChart is used to connect with patients for Virtual Visits (Telemedicine).  Patients are able to view lab/test results, encounter notes, upcoming appointments, etc.  Non-urgent messages can be sent to your provider as well.   To learn more about what you can do with MyChart, go to [email or web address].    Your next appointment:    Keep your scheduled appointment 10/14/2022 at 2:20pm  Provider:   Peter [place], MD

## 2022-10-14 ENCOUNTER — Ambulatory Visit: Payer: Medicare Other | Attending: Cardiology | Admitting: Cardiology

## 2022-10-14 ENCOUNTER — Encounter: Payer: Self-pay | Admitting: Cardiology

## 2022-10-14 VITALS — BP 120/76 | HR 78 | Ht 63.0 in | Wt 148.8 lb

## 2022-10-14 DIAGNOSIS — I10 Essential (primary) hypertension: Secondary | ICD-10-CM | POA: Diagnosis present

## 2022-10-14 DIAGNOSIS — I350 Nonrheumatic aortic (valve) stenosis: Secondary | ICD-10-CM | POA: Diagnosis present

## 2022-10-14 DIAGNOSIS — R002 Palpitations: Secondary | ICD-10-CM | POA: Diagnosis present

## 2022-10-14 NOTE — Patient Instructions (Signed)
Medication Instructions:  Continue same medications *If you need a refill on your cardiac medications before your next appointment, please call your pharmacy*   Lab Work: None ordered   Testing/Procedures: None ordered   Follow-Up: At [facility], you and your health needs are our priority.  As part of our continuing mission to provide you with exceptional heart care, we have created designated Provider Care Teams.  These Care Teams include your primary Cardiologist (physician) and Advanced Practice Providers (APPs -  Physician Assistants and Nurse Practitioners) who all work together to provide you with the care you need, when you need it.  We recommend signing up for the patient portal called "MyChart".  Sign up information is provided on this After Visit Summary.  MyChart is used to connect with patients for Virtual Visits (Telemedicine).  Patients are able to view lab/test results, encounter notes, upcoming appointments, etc.  Non-urgent messages can be sent to your provider as well.   To learn more about what you can do with MyChart, go to [email or web address].    Your next appointment:  1 year   Call in June to schedule Oct appointment     Provider:  Dr.Jordan

## 2023-10-07 NOTE — Progress Notes (Signed)
 Rollene SHAUNNA Sprang Date of Birth: 09-Jul-1933 Medical Record #[id number]  History of Present Illness: Ms. Cieslik is seen for followup of AS. She has HTN and HLD. She has a history of moderate aortic stenosis. She has a prior history of near syncope that resolved with reduction in BP meds. Event monitor showed rare PVCs- otherwise normal.   She was admitted in May 2024 with E coli bacteremia. BNP elevated. She apparently did receive some lasix . Was seen in our office with increased dyspnea. Echo ordered showed normal LV function with moderate aortic stenosis that was not significantly changed from prior studies. No change in mean gradient dating back to 2013.  Event monitor showed some brief runs of PAT.   On follow up today she is feeling very well.  She is living with her daughter she does note that she will have mild SOB walking up an incline. Rare palpitations. No chest pain. No change in the past year.    Current Outpatient Medications  Medication Sig Dispense Refill   acetic acid-hydrocortisone  (VOSOL-HC) OTIC solution PLEASE SEE ATTACHED FOR DETAILED DIRECTIONS     ALPRAZolam  (XANAX ) 0.5 MG tablet Take 0.5 mg by mouth daily as needed for anxiety or sleep. Take 1/2 to 1 tablet daily as needed     atropine  1 % ophthalmic solution INSTILL 1 DROP INTO LEFT EYE TWICE A DAY 2 mL 14   cholecalciferol (VITAMIN D3) 25 MCG (1000 UT) tablet Take 1,000 Units by mouth daily.     clotrimazole-betamethasone (LOTRISONE) cream Apply 1 Application topically 2 (two) times daily.     dimenhyDRINATE (DRAMAMINE) 50 MG tablet Take 50 mg by mouth every 8 (eight) hours as needed for nausea.     ESTRACE VAGINAL 0.1 MG/GM vaginal cream Apply 1 application topically every other day.   3   gabapentin (NEURONTIN) 100 MG capsule Take 100 mg by mouth 3 (three) times daily.     HYDROcodone -acetaminophen  (NORCO/VICODIN) 5-325 MG tablet Take 1 tablet by mouth every 6 (six) hours as needed.  0   hydrocortisone  2.5 % cream  Apply 1 application topically 2 (two) times daily as needed.     latanoprost  (XALATAN ) 0.005 % ophthalmic solution Place 1 drop into the right eye at bedtime.     levothyroxine  (SYNTHROID , LEVOTHROID) 88 MCG tablet Take 88 mcg by mouth daily before breakfast.     lidocaine  (XYLOCAINE ) 2 % solution Take 5 mLs by mouth 4 (four) times daily as needed.     methocarbamol  (ROBAXIN ) 750 MG tablet Take 750 mg by mouth every 6 (six) hours as needed for muscle spasms.     metoprolol  succinate (TOPROL -XL) 50 MG 24 hr tablet TAKE 1 TABLET (50 MG TOTAL) BY MOUTH EVERY MORNING AND 0.5 TABLETS (25 MG TOTAL) EVERY EVENING. 135 tablet 3   Multiple Vitamins-Minerals (PRESERVISION AREDS) CAPS Take 1 capsule by mouth daily.     mupirocin ointment (BACTROBAN) 2 % Apply 1 Application topically daily.     pantoprazole  (PROTONIX ) 40 MG tablet Take 40 mg by mouth daily.     pravastatin  (PRAVACHOL ) 80 MG tablet Take 80 mg by mouth daily.     prednisoLONE  acetate (PRED FORTE ) 1 % ophthalmic suspension Place 1 drop into the left eye daily.     sertraline  (ZOLOFT ) 100 MG tablet Take 100 mg by mouth daily.     vitamin C (ASCORBIC ACID) 500 MG tablet Take 500 mg by mouth daily.     No current facility-administered medications for this visit.  Allergies  Allergen Reactions   Lisinopril Itching    itching Other   Sulfa Antibiotics Anaphylaxis   Ciprofloxacin Hcl     Other reaction(s): Other Muscle pain   Nitrofurantoin Itching    Other Other   Hydrochlorothiazide Other (See Comments)    Cramps  Cramps Cramps Other    Past Medical History:  Diagnosis Date   Anemia    Anxiety    Aortic stenosis, moderate    CHF (congestive heart failure) (HCC) 2006   DDD (degenerative disc disease), lumbar    Full dentures    upper   GERD (gastroesophageal reflux disease)    Hearing loss of both ears    wears hearing aides   Hepatitis 1964   HTN (hypertension), benign    sees Dr Raeanna Soberanes [place]and annually to check heart  valve   Hypothyroidism    Macular degeneration, bilateral    Migraine    Mixed hyperlipidemia    Murmur    Osteoarthritis    PNA (pneumonia) 2006   PONV (postoperative nausea and vomiting)    Syncope    UTI (lower urinary tract infection)     Past Surgical History:  Procedure Laterality Date   BACK SURGERY  2004   CATARACT EXTRACTION     COLONOSCOPY W/ POLYPECTOMY  2016   EYE SURGERY     bilateral detatched retina   FOOT SURGERY     LUMBAR FUSION  2016   THYROID SURGERY      Social History   Tobacco Use  Smoking Status Never  Smokeless Tobacco Never    Social History   Substance and Sexual Activity  Alcohol Use No    Family History  Problem Relation Age of Onset   Hypertension Mother    Stroke Mother    Diabetes Father    Hypertension Brother    Rheumatic fever Brother    Diabetes Other    Hypertension Other    Stroke Other    Arthritis Other    Migraines Other     Review of Systems: The review of systems is per the HPI.  All other systems were reviewed and are negative.  Physical Exam: BP 124/76 (BP Location: Right Arm, Cuff Size: Normal)   Pulse 76   Resp 16   Ht 5' 3 (1.6 m)   Wt 152 lb (68.9 kg)   SpO2 98%   BMI 26.93 kg/m  GENERAL:  Well appearing elderly WF in NAD HEENT:  PERRL, EOMI, sclera are clear. Oropharynx is clear. NECK:  No jugular venous distention, carotid upstroke brisk and symmetric, no bruits, no thyromegaly or adenopathy LUNGS:  Clear to auscultation bilaterally CHEST:  Unremarkable HEART:  RRR,  PMI not displaced or sustained,S1 and S2 within normal limits, no S3, no S4: no clicks, no rubs, gr 2/6 systolic murmur RUSB ABD:  Soft, nontender. BS +, no masses or bruits. No hepatomegaly, no splenomegaly EXT:  2 + pulses throughout, no edema, no cyanosis no clubbing SKIN:  Warm and dry.  No rashes NEURO:  Alert and oriented x 3. Cranial nerves II through XII intact. PSYCH:  Cognitively intact      LABORATORY  DATA:  EKG Interpretation Date/Time:  Monday October 16 2023 14:27:19 EDT Ventricular Rate:  73 PR Interval:  166 QRS Duration:  70 QT Interval:  402 QTC Calculation: 442 R Axis:   -12  Text Interpretation: Sinus rhythm with occasional Premature ventricular complexes Minimal voltage criteria for LVH, may be normal variant ( R  in aVL ) When compared with ECG of 14-May-2022 11:21, Premature ventricular complexes are new Confirmed by [place], Leontine Radman [phone]) on 10/16/2023 2:31:46 PM    Lab Results  Component Value Date   WBC 5.8 05/30/2022   HGB 12.1 05/30/2022   HCT 36.6 05/30/2022   PLT 323 05/30/2022   GLUCOSE 129 (H) 06/10/2022   ALT 11 05/14/2022   AST 18 05/14/2022   NA 135 06/10/2022   K 4.9 06/10/2022   CL 99 06/10/2022   CREATININE 1.06 (H) 06/10/2022   BUN 17 06/10/2022   CO2 20 06/10/2022    Labs dated 05/27/15: cholesterol 146, triglycerides 127, HDL 46, LDL 75. CMET and CBC normal. Dated 06/08/16: cholesterol 145, triglycerides 118, HDL 45, LDL 76. CBC normal. CMET in October 2018 normal.   Echo 06/03/22: IMPRESSIONS     1. Left ventricular ejection fraction, by estimation, is 70 to 75%. The  left ventricle has hyperdynamic function. The left ventricle has no  regional wall motion abnormalities. There is severe concentric left  ventricular hypertrophy. Left ventricular  diastolic parameters are consistent with Grade I diastolic dysfunction  (impaired relaxation).   2. Right ventricular systolic function is normal. The right ventricular  size is normal. Tricuspid regurgitation signal is inadequate for assessing  PA pressure.   3. Left atrial size was mildly dilated.   4. The mitral valve is normal in structure. No evidence of mitral valve  regurgitation. No evidence of mitral stenosis. Moderate mitral annular  calcification.   5. The aortic valve is calcified. Aortic valve regurgitation is mild.  Moderate aortic valve stenosis.   6. The inferior vena cava is  normal in size with greater than 50%  respiratory variability, suggesting right atrial pressure of 3 mmHg.   Event monitor 06/27/22: tudy Highlights      The dominant rhythm is normal sinus rhythm with normal circadian variation.   There are rare premature atrial beats, but there are a total of 30 episodes of brief nonsustained ectopic atrial tachycardia (maximum 19 beats).  Atrial fibrillation was not seen.  The episodes of atrial tachycardia do not appear to have a particular diary and all distribution.   Occasional premature ventricular contractions are seen (representing 1.7% of all recorded beats), but there is no's of ventricular tachycardia.  There is a notable increase in the prevalence of premature ventricular contractions during expected sleep hours, at night.   There is no evidence of significant bradycardia, pauses or high-grade atrioventricular block.   There were no patient triggered recordings submitted.   Mildly abnormal arrhythmia monitor due to the occurrence of occasional brief episodes of nonsustained ectopic atrial tachycardia and occasional premature ventricular contractions. There is no evidence of ventricular tachycardia or atrial fibrillation. Premature ventricular contractions are substantially more frequent during expected sleep hours.  Consider association with obstructive sleep apnea.       Assessment / Plan: 1.  Moderate aortic stenosis. Stable by Echo for many years. Not really symptomatic. Will continue monitoring. I don't feel we need to repeat Echo this year given stability.   2. Orthostatic hypotension. Improved with stopping amlodipine . Continue Toprol  XL only for BP.   3. Hypertension-well-controlled.  4. Rare PVCs, brief PAT. Continue Toprol    I will see in one year

## 2023-10-16 ENCOUNTER — Encounter: Payer: Self-pay | Admitting: Cardiology

## 2023-10-16 ENCOUNTER — Ambulatory Visit: Attending: Cardiology | Admitting: Cardiology

## 2023-10-16 VITALS — BP 124/76 | HR 76 | Resp 16 | Ht 63.0 in | Wt 152.0 lb

## 2023-10-16 DIAGNOSIS — I350 Nonrheumatic aortic (valve) stenosis: Secondary | ICD-10-CM | POA: Diagnosis not present

## 2023-10-16 DIAGNOSIS — I10 Essential (primary) hypertension: Secondary | ICD-10-CM | POA: Insufficient documentation

## 2023-10-16 NOTE — Patient Instructions (Signed)
